# Patient Record
Sex: Female | Born: 1980 | Hispanic: Yes | Marital: Married | Smoking: Never smoker
Health system: Southern US, Community
[De-identification: ages and names within clinical notes are randomized; demographics above are authoritative.]

## PROBLEM LIST (undated history)

## (undated) DIAGNOSIS — O00109 Unspecified tubal pregnancy without intrauterine pregnancy: Secondary | ICD-10-CM

## (undated) DIAGNOSIS — O039 Complete or unspecified spontaneous abortion without complication: Secondary | ICD-10-CM

## (undated) HISTORY — PX: NO PAST SURGERIES: SHX2092

---

## 2008-11-20 ENCOUNTER — Emergency Department (HOSPITAL_COMMUNITY): Admission: EM | Admit: 2008-11-20 | Discharge: 2008-11-20 | Payer: Self-pay | Admitting: Emergency Medicine

## 2009-02-27 ENCOUNTER — Other Ambulatory Visit: Admission: RE | Admit: 2009-02-27 | Discharge: 2009-02-27 | Payer: Self-pay | Admitting: Obstetrics and Gynecology

## 2009-10-22 ENCOUNTER — Inpatient Hospital Stay (HOSPITAL_COMMUNITY): Admission: AD | Admit: 2009-10-22 | Discharge: 2009-10-23 | Payer: Self-pay | Admitting: Obstetrics & Gynecology

## 2009-10-23 ENCOUNTER — Inpatient Hospital Stay (HOSPITAL_COMMUNITY): Admission: RE | Admit: 2009-10-23 | Discharge: 2009-10-24 | Payer: Self-pay | Admitting: Obstetrics and Gynecology

## 2009-10-23 ENCOUNTER — Ambulatory Visit: Payer: Self-pay | Admitting: Family Medicine

## 2009-10-28 ENCOUNTER — Observation Stay (HOSPITAL_COMMUNITY): Admission: AD | Admit: 2009-10-28 | Discharge: 2009-10-28 | Payer: Self-pay | Admitting: Obstetrics & Gynecology

## 2009-10-28 ENCOUNTER — Ambulatory Visit: Payer: Self-pay | Admitting: Family Medicine

## 2010-01-09 ENCOUNTER — Emergency Department (HOSPITAL_COMMUNITY): Admission: EM | Admit: 2010-01-09 | Discharge: 2010-01-09 | Payer: Self-pay | Admitting: Emergency Medicine

## 2010-10-02 ENCOUNTER — Emergency Department (HOSPITAL_COMMUNITY): Admission: EM | Admit: 2010-10-02 | Discharge: 2010-10-02 | Payer: Self-pay | Admitting: Emergency Medicine

## 2011-02-16 LAB — DIFFERENTIAL
Basophils Absolute: 0 10*3/uL (ref 0.0–0.1)
Basophils Relative: 0 % (ref 0–1)
Neutro Abs: 2.9 10*3/uL (ref 1.7–7.7)
Neutrophils Relative %: 48 % (ref 43–77)

## 2011-02-16 LAB — BASIC METABOLIC PANEL
BUN: 9 mg/dL (ref 6–23)
Calcium: 8.7 mg/dL (ref 8.4–10.5)
GFR calc non Af Amer: >60 mL/min (ref >60)
Glucose, Bld: 93 mg/dL (ref 70–99)
Sodium: 138 mEq/L (ref 135–145)

## 2011-02-16 LAB — URINALYSIS, ROUTINE W REFLEX MICROSCOPIC
Nitrite: NEGATIVE
Protein, ur: NEGATIVE mg/dL
Specific Gravity, Urine: <1.005 — ABNORMAL LOW (ref 1.005–1.030)
Urobilinogen, UA: 0.2 mg/dL (ref 0.0–1.0)

## 2011-02-16 LAB — GC/CHLAMYDIA PROBE AMP, GENITAL
Chlamydia, DNA Probe: NEGATIVE
GC Probe Amp, Genital: NEGATIVE

## 2011-02-16 LAB — CBC
MCHC: 33.7 g/dL (ref 30.0–36.0)
Platelets: 252 10*3/uL (ref 150–400)
RDW: 13.5 % (ref 11.5–15.5)
WBC: 6.1 10*3/uL (ref 4.0–10.5)

## 2011-02-16 LAB — ABO/RH: ABO/RH(D): O POS

## 2011-02-16 LAB — WET PREP, GENITAL
Clue Cells Wet Prep HPF POC: NONE SEEN
Trich, Wet Prep: NONE SEEN

## 2011-02-16 LAB — URINE MICROSCOPIC-ADD ON

## 2011-02-16 LAB — HCG, QUANTITATIVE, PREGNANCY: hCG, Beta Chain, Quant, S: <2 m[IU]/mL (ref <5)

## 2011-02-24 LAB — URINALYSIS, ROUTINE W REFLEX MICROSCOPIC
Glucose, UA: NEGATIVE mg/dL
Ketones, ur: NEGATIVE mg/dL
Leukocytes, UA: NEGATIVE
Nitrite: NEGATIVE
Protein, ur: NEGATIVE mg/dL
Urobilinogen, UA: 0.2 mg/dL (ref 0.0–1.0)

## 2011-02-24 LAB — RAPID STREP SCREEN (MED CTR MEBANE ONLY): Streptococcus, Group A Screen (Direct): NEGATIVE

## 2011-03-02 ENCOUNTER — Ambulatory Visit: Payer: Self-pay | Admitting: Gastroenterology

## 2011-03-09 LAB — COMPREHENSIVE METABOLIC PANEL
AST: 52 U/L — ABNORMAL HIGH (ref 0–37)
Albumin: 2.7 g/dL — ABNORMAL LOW (ref 3.5–5.2)
CO2: 29 mEq/L (ref 19–32)
Calcium: 8.3 mg/dL — ABNORMAL LOW (ref 8.4–10.5)
Creatinine, Ser: 0.55 mg/dL (ref 0.4–1.2)
GFR calc Af Amer: >60 mL/min (ref >60)
GFR calc non Af Amer: >60 mL/min (ref >60)

## 2011-03-09 LAB — CBC
HCT: 33.9 % — ABNORMAL LOW (ref 36.0–46.0)
Hemoglobin: 11.1 g/dL — ABNORMAL LOW (ref 12.0–15.0)
MCHC: 33.6 g/dL (ref 30.0–36.0)
MCV: 92.2 fL (ref 78.0–100.0)
MCV: 92.8 fL (ref 78.0–100.0)
Platelets: 178 10*3/uL (ref 150–400)
Platelets: 220 10*3/uL (ref 150–400)
RBC: 3.48 MIL/uL — ABNORMAL LOW (ref 3.87–5.11)
WBC: 5.8 10*3/uL (ref 4.0–10.5)
WBC: 6.7 10*3/uL (ref 4.0–10.5)

## 2011-03-09 LAB — URINE MICROSCOPIC-ADD ON

## 2011-03-09 LAB — URINALYSIS, ROUTINE W REFLEX MICROSCOPIC
Bilirubin Urine: NEGATIVE
Bilirubin Urine: NEGATIVE
Glucose, UA: NEGATIVE mg/dL
Ketones, ur: NEGATIVE mg/dL
Protein, ur: NEGATIVE mg/dL
Specific Gravity, Urine: <1.005 — ABNORMAL LOW (ref 1.005–1.030)
Urobilinogen, UA: 0.2 mg/dL (ref 0.0–1.0)

## 2011-03-09 LAB — HEPATIC FUNCTION PANEL
AST: 50 U/L — ABNORMAL HIGH (ref 0–37)
Albumin: 2.7 g/dL — ABNORMAL LOW (ref 3.5–5.2)
Alkaline Phosphatase: 93 U/L (ref 39–117)
Total Bilirubin: 0.1 mg/dL — ABNORMAL LOW (ref 0.3–1.2)

## 2011-03-09 LAB — BASIC METABOLIC PANEL
CO2: 28 mEq/L (ref 19–32)
Calcium: 8.5 mg/dL (ref 8.4–10.5)
GFR calc Af Amer: >60 mL/min (ref >60)
GFR calc non Af Amer: >60 mL/min (ref >60)
Sodium: 139 mEq/L (ref 135–145)

## 2012-04-11 ENCOUNTER — Ambulatory Visit: Payer: Self-pay

## 2012-04-11 ENCOUNTER — Ambulatory Visit (HOSPITAL_COMMUNITY): Admission: RE | Admit: 2012-04-11 | Payer: Self-pay | Source: Ambulatory Visit

## 2012-04-11 ENCOUNTER — Ambulatory Visit (HOSPITAL_COMMUNITY)
Admission: RE | Admit: 2012-04-11 | Discharge: 2012-04-11 | Disposition: A | Discharge status: Home / Self Care | Payer: Self-pay | Source: Ambulatory Visit | Attending: Orthopedic Surgery | Admitting: Orthopedic Surgery

## 2012-04-11 ENCOUNTER — Encounter: Payer: Self-pay | Admitting: Orthopedic Surgery

## 2012-04-11 ENCOUNTER — Other Ambulatory Visit: Payer: Self-pay | Admitting: Orthopedic Surgery

## 2012-04-11 ENCOUNTER — Telehealth: Payer: Self-pay | Admitting: *Deleted

## 2012-04-11 ENCOUNTER — Ambulatory Visit (INDEPENDENT_AMBULATORY_CARE_PROVIDER_SITE_OTHER): Payer: Self-pay | Admitting: Orthopedic Surgery

## 2012-04-11 VITALS — BP 102/60 | Ht 65.0 in | Wt 172.0 lb

## 2012-04-11 DIAGNOSIS — M25561 Pain in right knee: Secondary | ICD-10-CM

## 2012-04-11 DIAGNOSIS — M2241 Chondromalacia patellae, right knee: Secondary | ICD-10-CM

## 2012-04-11 DIAGNOSIS — S8000XA Contusion of unspecified knee, initial encounter: Secondary | ICD-10-CM

## 2012-04-11 DIAGNOSIS — M25569 Pain in unspecified knee: Secondary | ICD-10-CM

## 2012-04-11 DIAGNOSIS — M2240 Chondromalacia patellae, unspecified knee: Secondary | ICD-10-CM

## 2012-04-11 MED ORDER — PREDNISONE 5 MG PO KIT: 5.0000 mg | PACK | ORAL | Status: DC

## 2012-04-11 MED ORDER — ACETAMINOPHEN-CODEINE 300-30 MG PO TABS: 1.0000 | ORAL_TABLET | ORAL | Status: AC | PRN

## 2012-04-11 NOTE — Telephone Encounter (Signed)
Call complete

## 2012-04-11 NOTE — Patient Instructions (Signed)
You have been given 2 prescriptions. One is at the pharmacy. Please pick up the other one has been given to you by hand. Please take to the pharmacy to get filled.  No surgical treatment is necessary. This will be self-limiting

## 2012-04-11 NOTE — Progress Notes (Signed)
  Subjective:    Kim Marks is a 31 y.o. female who presents the following history. About one month ago. The patient was moving her crib and hit the RIGHT knee against the side of the crib since that time. She is a dull, aching anterior knee pain with 6/10 in intensity, which seems to come and go is worse with steps. She also feels crepitation when climbing stairs. She's noticed some swelling and some numbness at the site of injury. She had some swelling and not formed initially, but has resolved. No previous treatment. Review of systems blurred vision or eye pain, constipation, and numbness, otherwise, normal The following portions of the patient's history were reviewed and updated as appropriate: allergies, current medications, past family history, past medical history, past social history, past surgical history and problem list.   Review of Systems Pertinent items are noted in HPI.   Objective:    BP 102/60  Ht 5\' 5"  (1.651 m)  Wt 172 lb (78.019 kg)  BMI 28.62 kg/m2 Vital signs are stable as recorded  General appearance is normal  The patient is alert and oriented x3  The patient's mood and affect are normal  Gait assessment: normal  The cardiovascular exam reveals normal pulses and temperature without edema swelling.  The lymphatic system is negative for palpable lymph nodes  The sensory exam is normal.  There are no pathologic reflexes.  Balance is normal.   Exam of the RIGHT knee reveals crepitation on range of motion tenderness over the lateral joint line, just below the joint on the tibial surface. Range of motion is normal. The knee is stable. Strength is normal. Skin is intact.  Exam of the LEFT knee reveals mild crepitation. Normal range of motion strength, stability and alignment with normal skin and      X-ray x-ray had been done at the [facility] secondary to equipment malfunction:   The x-ray was normal   Assessment:    Right Moderate  patellofemoral syndrome bilaterally    Plan:    recommend medication for this should be self-limiting. No further treatment need x-ray followup will be done in a phone call will be made

## 2012-04-12 ENCOUNTER — Ambulatory Visit: Payer: Self-pay | Admitting: Orthopedic Surgery

## 2012-08-23 IMAGING — US US OB COMP LESS 14 WK
1 series · 14 of 28 positions shown · non-contrast
Comparison: None.

CLINICAL DATA: Report of pregnancy.  Vaginal bleeding and pain.
Estimated gestational age by LMP 6 weeks 1 day.  Quantitative beta
HCG level not available the time ultrasound was performed.

OBSTETRIC <14 WK US AND TRANSVAGINAL OB US
TECHNIQUE: Both transabdominal and transvaginal ultrasound
examinations were performed for complete evaluation of the
gestation as well as the maternal uterus, adnexal regions, and
pelvic cul-de-sac.  Transvaginal technique was performed to assess
early pregnancy.

[Series 1: us ob comp less 14 wk · 0.26mm/px · 83 acquisitions, 14 frames shown]
[im 4/83]
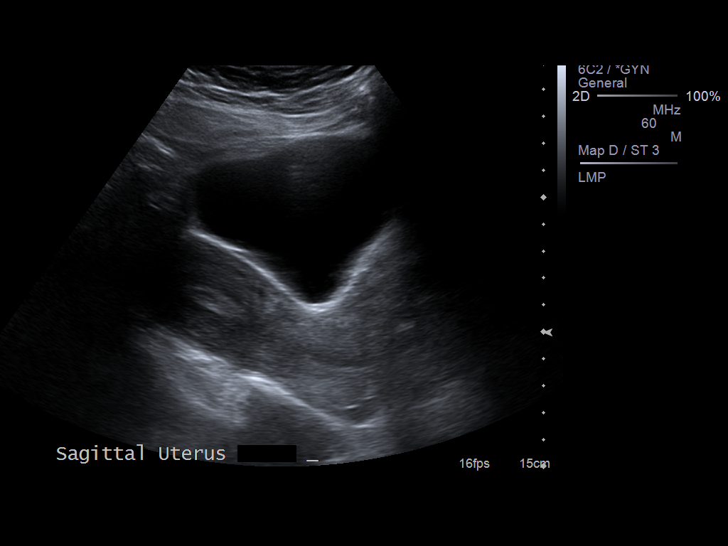
[im 10/83]
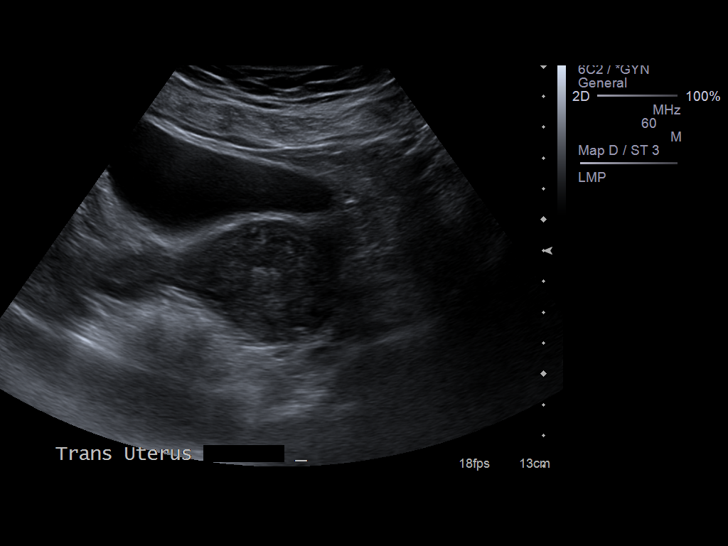
[im 16/83]
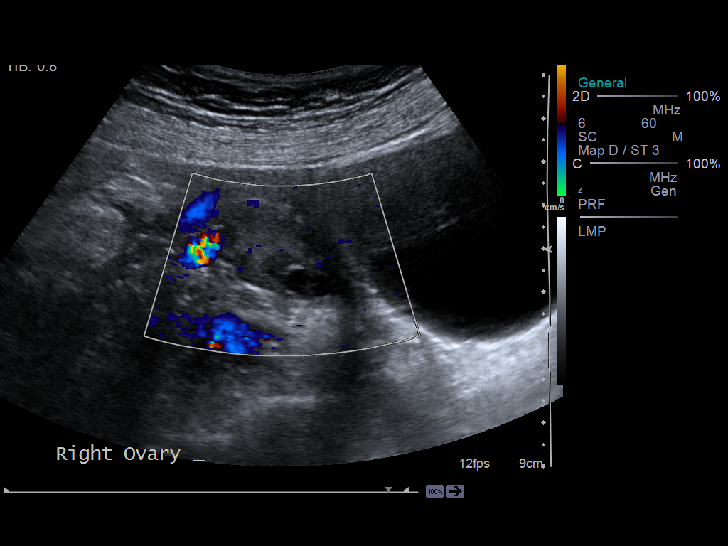
[im 22/83]
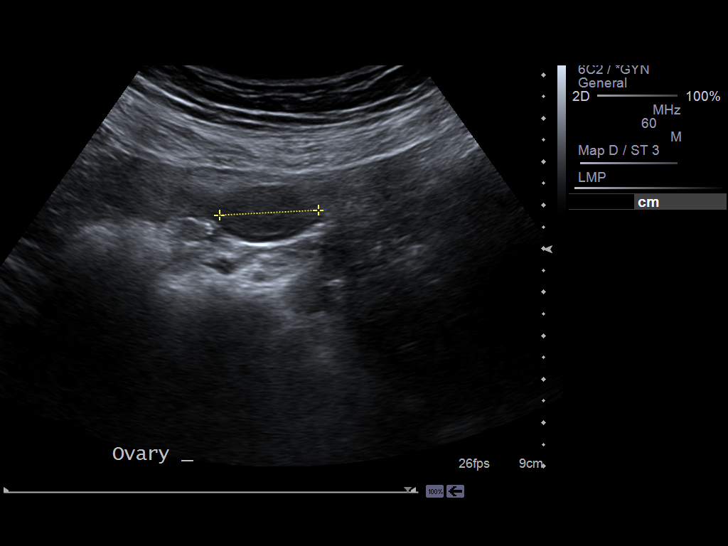
[im 28/83]
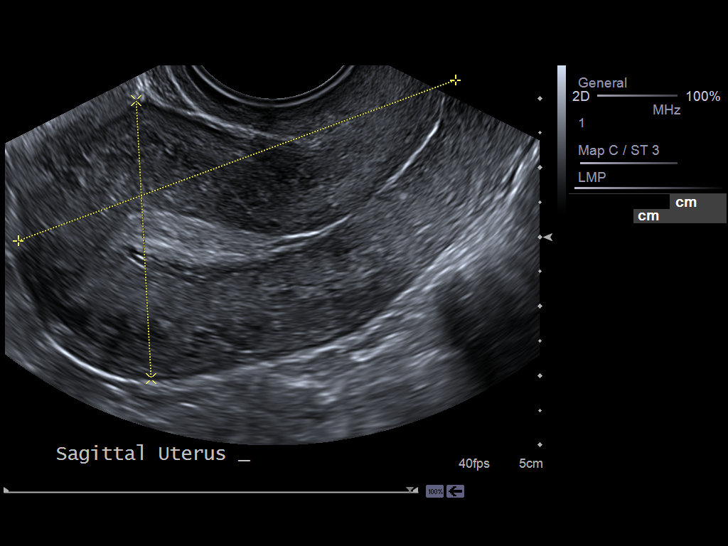
[im 34/83]
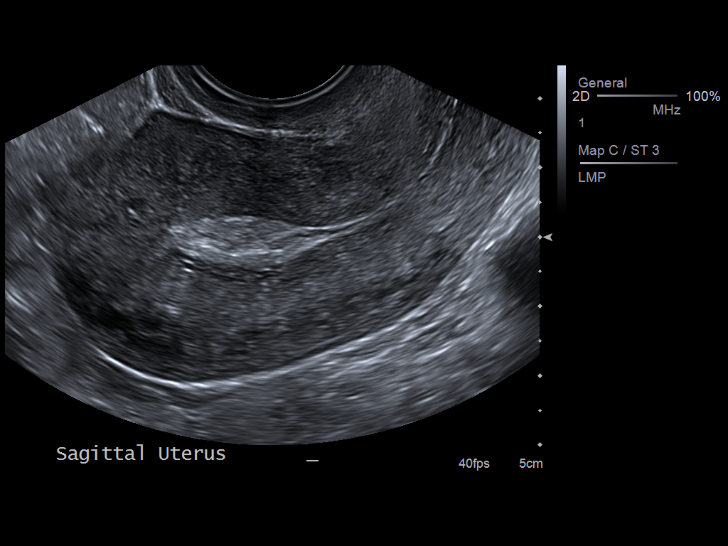
[im 40/83]
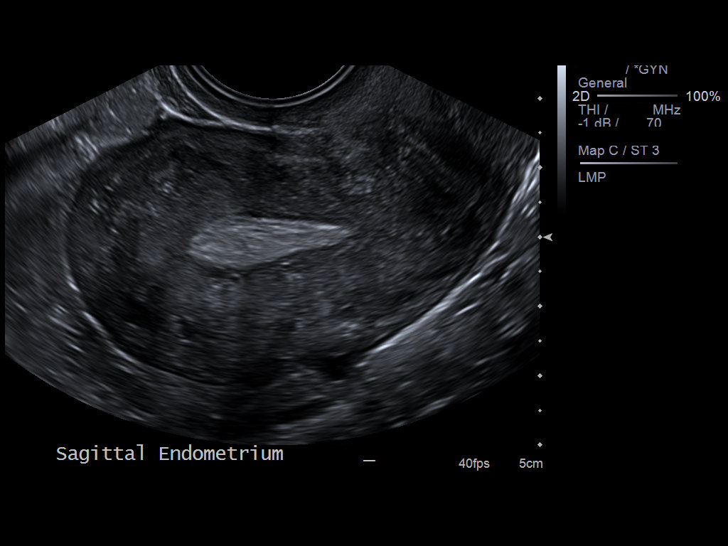
[im 46/83]
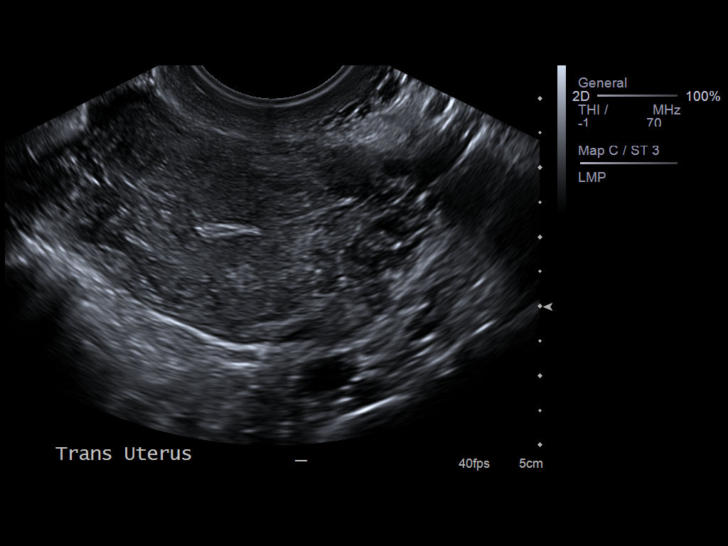
[im 52/83]
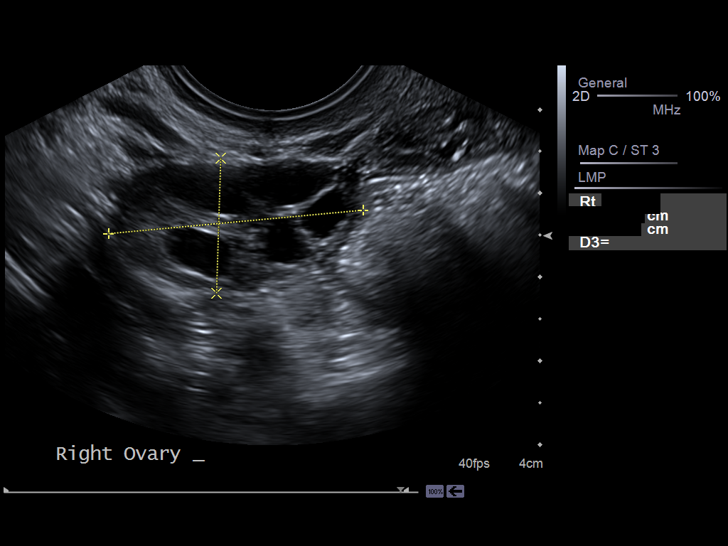
[im 58/83]
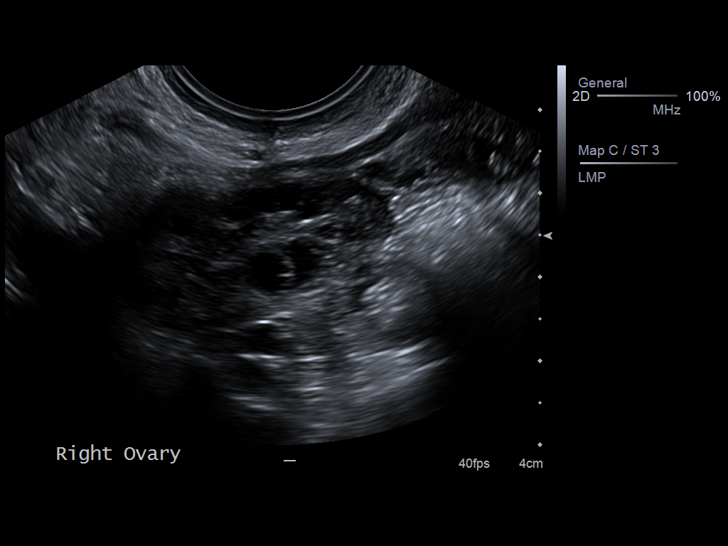
[im 64/83]
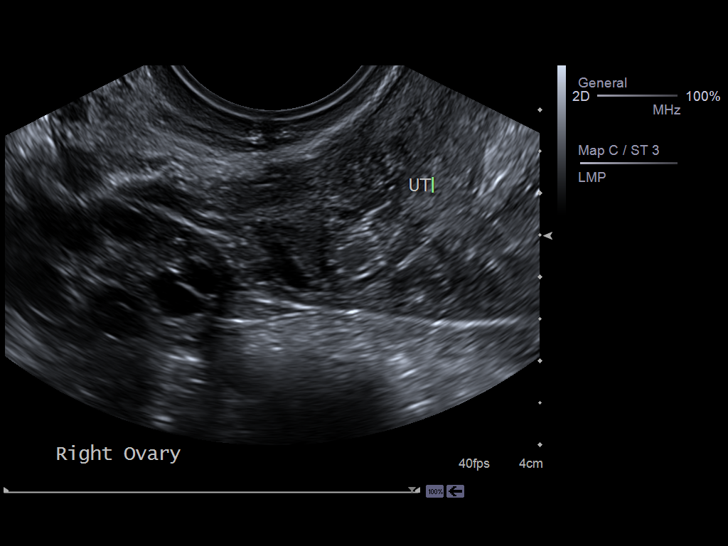
[im 70/83]
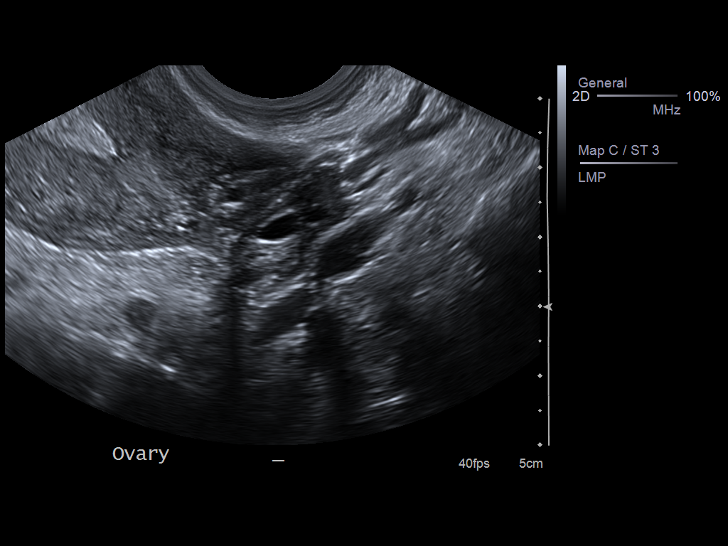
[im 76/83]
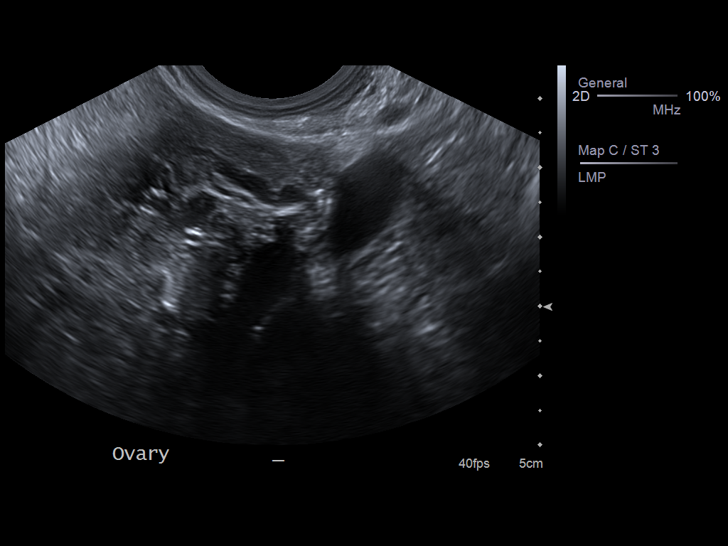
[im 83/83]
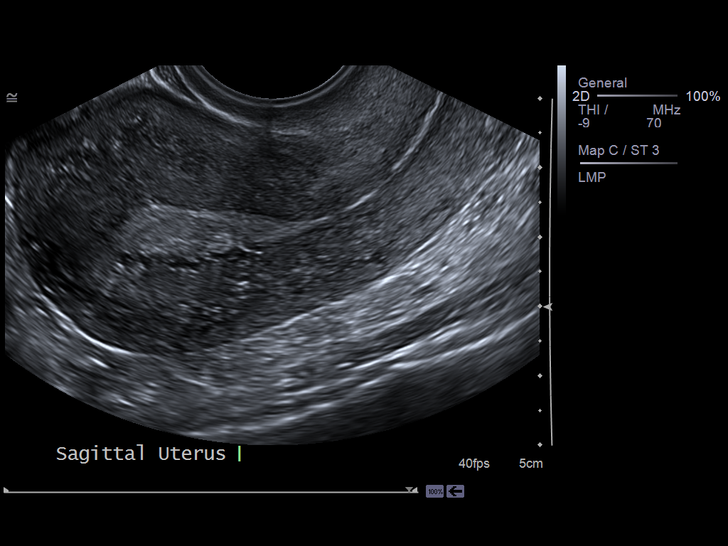

[14 of 28 positions shown; findings below may reference images not displayed]

Intrauterine gestational sac:  None
Yolk sac: None
Embryo: None

Maternal uterus/adnexae:

The endometrium measures 8 mm in thickness and is homogeneous. Both
ovaries have normal sonographic appearances, with several follicles
within each ovary.  Color Doppler flow is identified in both
ovaries.

No adnexal mass or free fluid is seen.
IMPRESSION: No intrauterine pregnancy or adnexal mass is identified.
Endometrium measures 8 mm.

Considerations include spontaneous abortion,  IUP too early to
visualize, or occult ectopic pregnancy.  Suggest correlation with
Beta HCG levels, and follow-up ultrasound as clinically indicated.

## 2013-09-07 ENCOUNTER — Other Ambulatory Visit: Payer: Self-pay | Admitting: Obstetrics and Gynecology

## 2013-10-22 ENCOUNTER — Ambulatory Visit: Payer: Self-pay | Admitting: Orthopedic Surgery

## 2013-10-22 ENCOUNTER — Encounter: Payer: Self-pay | Admitting: Orthopedic Surgery

## 2014-03-03 IMAGING — CR DG KNEE AP/LAT W/ SUNRISE*R*
3 series · 3 of 3 positions shown · non-contrast
Comparison: None

CLINICAL DATA: Injured right knee.

DG KNEE - 3 VIEWS

[view not recorded (1 of 3)]
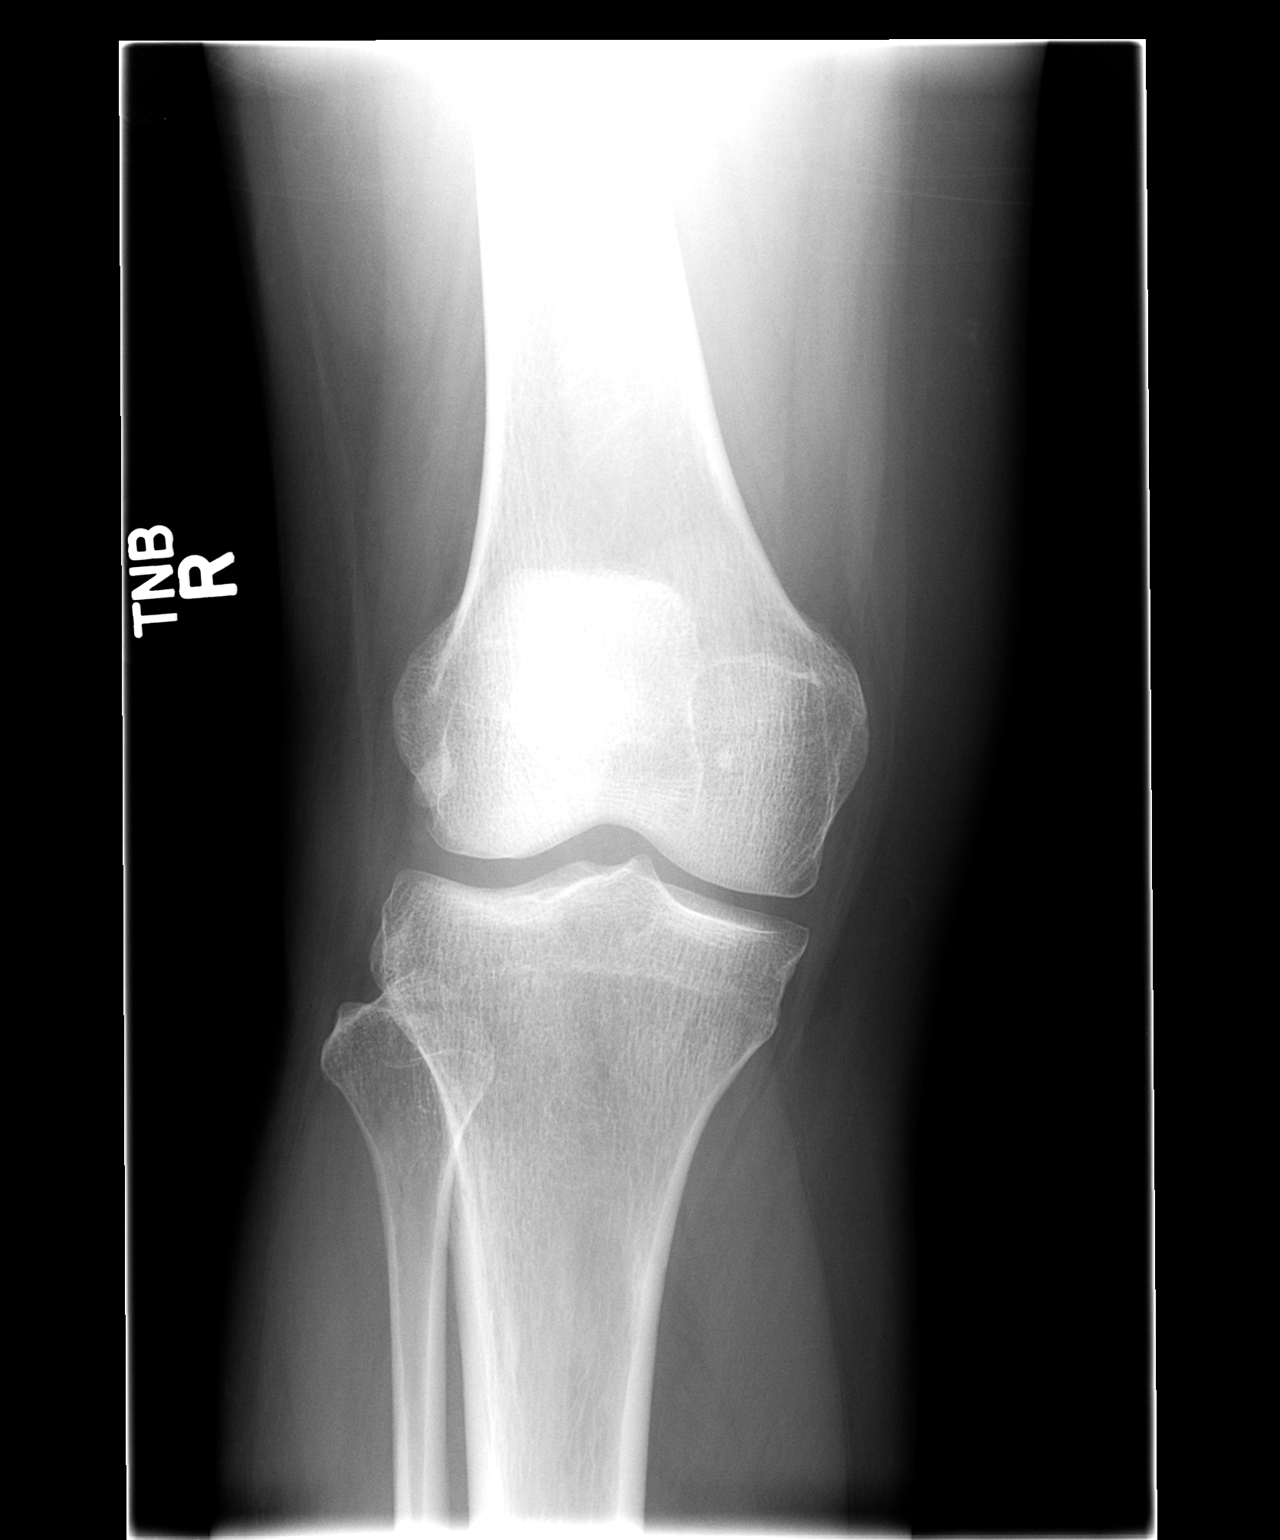

[view not recorded (2 of 3)]
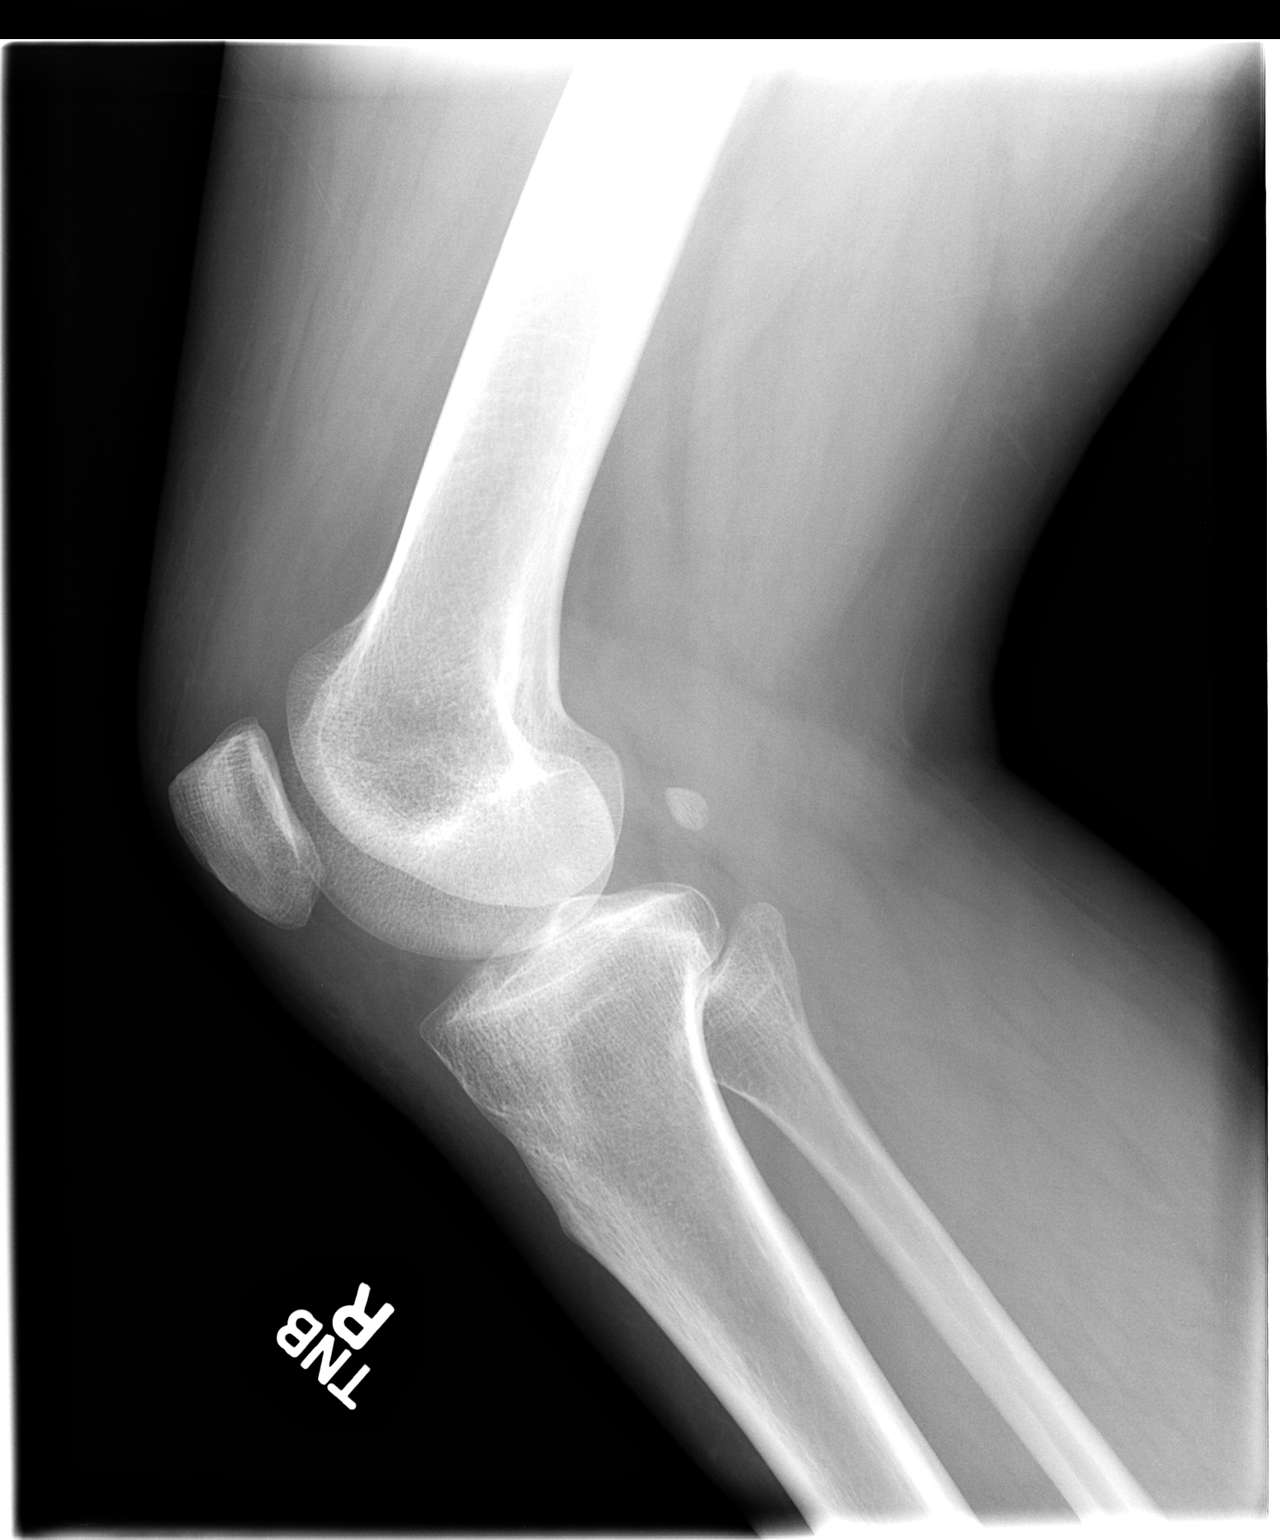

[view not recorded (3 of 3)]
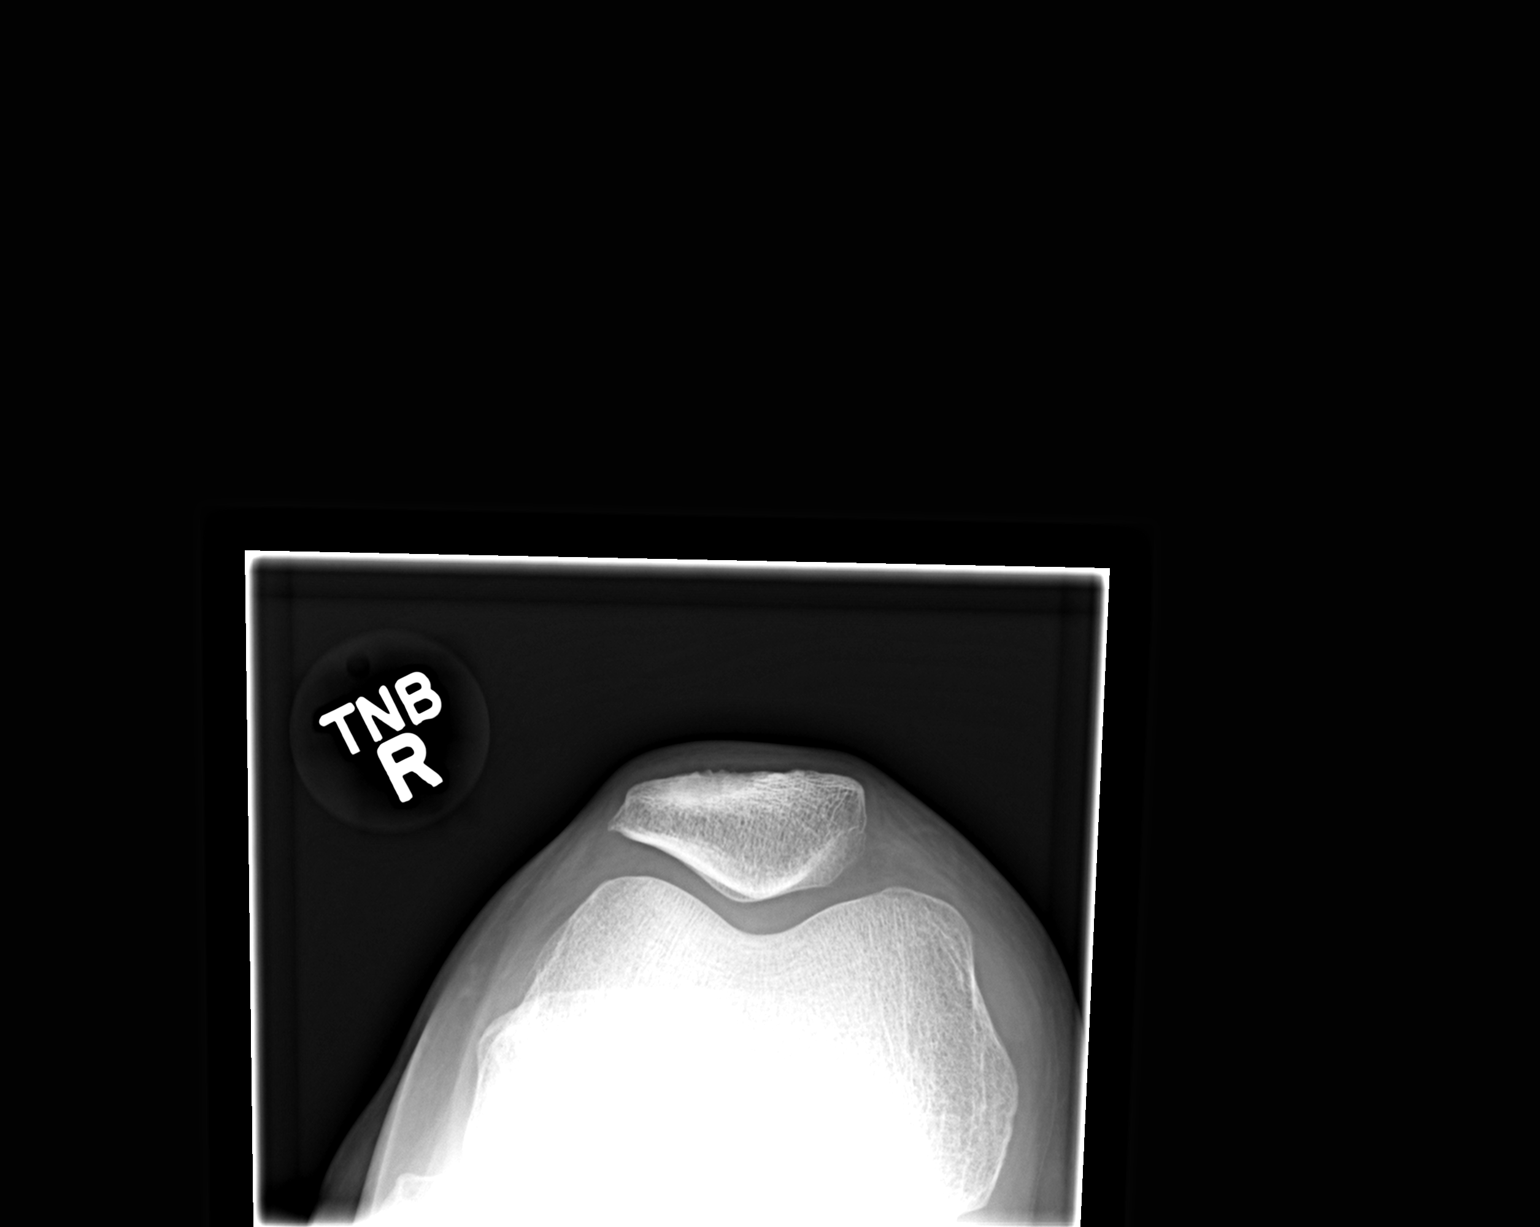

[3 of 3 positions shown; findings below may reference images not displayed]

FINDINGS: The joint spaces are maintained.  No acute bony findings
or osteochondral abnormality.  No degenerative changes.  No
definite joint effusion.
IMPRESSION: No acute bony findings.

## 2014-12-05 NOTE — L&D Delivery Note (Cosign Needed)
Delivery Note Pt progressed nicely and after a 10  Minute 2nd stage, at 8:56 AM a viable female was delivered via Vaginal, Spontaneous Delivery (Presentation: ;  ).  APGAR: 8, 9; weight pending.  After 3 minutes, the cord was clamped and cut. 40 units of pitocin diluted in 1000cc LR was infused rapidly IV.  The placenta separated spontaneously and delivered via CCT and maternal pushing effort.  It was inspected and appears to be intact with a 3 VC.   Anesthesia: Epidural  Episiotomy: None Lacerations: None Suture Repair: na Est. Blood Loss (mL): 84  Mom to postpartum.  Baby to Couplet care / Skin to Skin.  CRESENZO-DISHMAN,Trevious Rampey 08/21/2015, 9:13 AM

## 2015-01-27 ENCOUNTER — Other Ambulatory Visit: Payer: Self-pay | Admitting: Obstetrics & Gynecology

## 2015-01-27 DIAGNOSIS — O3680X0 Pregnancy with inconclusive fetal viability, not applicable or unspecified: Secondary | ICD-10-CM

## 2015-01-30 ENCOUNTER — Encounter: Payer: Self-pay | Admitting: *Deleted

## 2015-02-02 ENCOUNTER — Ambulatory Visit (INDEPENDENT_AMBULATORY_CARE_PROVIDER_SITE_OTHER): Payer: Commercial Indemnity

## 2015-02-02 ENCOUNTER — Other Ambulatory Visit: Payer: Self-pay | Admitting: Obstetrics & Gynecology

## 2015-02-02 DIAGNOSIS — O3680X0 Pregnancy with inconclusive fetal viability, not applicable or unspecified: Secondary | ICD-10-CM

## 2015-02-02 DIAGNOSIS — O26841 Uterine size-date discrepancy, first trimester: Secondary | ICD-10-CM

## 2015-02-02 NOTE — Progress Notes (Signed)
U/S-single IUP with +FCA noted, FHR-165 bpm, cx appears closed, bilateral adnexa appears WNL, CRL c/w 12+1wks EDD 08/16/2015, pt refused NT/IT screen, anterior Gr 0 placenta

## 2015-02-03 ENCOUNTER — Emergency Department (HOSPITAL_COMMUNITY)
Admission: EM | Admit: 2015-02-03 | Discharge: 2015-02-04 | Disposition: A | Discharge status: Home / Self Care | Payer: Managed Care, Other (non HMO) | Attending: Emergency Medicine | Admitting: Emergency Medicine

## 2015-02-03 ENCOUNTER — Encounter (HOSPITAL_COMMUNITY): Payer: Self-pay

## 2015-02-03 DIAGNOSIS — Z3A12 12 weeks gestation of pregnancy: Secondary | ICD-10-CM | POA: Insufficient documentation

## 2015-02-03 DIAGNOSIS — Z79899 Other long term (current) drug therapy: Secondary | ICD-10-CM | POA: Insufficient documentation

## 2015-02-03 DIAGNOSIS — O209 Hemorrhage in early pregnancy, unspecified: Secondary | ICD-10-CM | POA: Insufficient documentation

## 2015-02-03 HISTORY — DX: Complete or unspecified spontaneous abortion without complication: O03.9

## 2015-02-03 HISTORY — DX: Unspecified tubal pregnancy without intrauterine pregnancy: O00.109

## 2015-02-03 LAB — BASIC METABOLIC PANEL
ANION GAP: 7 (ref 5–15)
BUN: 8 mg/dL (ref 6–23)
CO2: 24 mmol/L (ref 19–32)
Calcium: 9 mg/dL (ref 8.4–10.5)
Chloride: 103 mmol/L (ref 96–112)
Creatinine, Ser: 0.5 mg/dL (ref 0.50–1.10)
GFR calc Af Amer: >90 mL/min (ref >90)
GFR calc non Af Amer: >90 mL/min (ref >90)
Glucose, Bld: 102 mg/dL — ABNORMAL HIGH (ref 70–99)
Potassium: 3.3 mmol/L — ABNORMAL LOW (ref 3.5–5.1)
SODIUM: 134 mmol/L — AB (ref 135–145)

## 2015-02-03 LAB — CBC WITH DIFFERENTIAL/PLATELET
BASOS PCT: 0 % (ref 0–1)
Basophils Absolute: 0 10*3/uL (ref 0.0–0.1)
EOS ABS: 0.1 10*3/uL (ref 0.0–0.7)
EOS PCT: 1 % (ref 0–5)
HEMATOCRIT: 34.1 % — AB (ref 36.0–46.0)
HEMOGLOBIN: 12 g/dL (ref 12.0–15.0)
LYMPHS ABS: 2.7 10*3/uL (ref 0.7–4.0)
Lymphocytes Relative: 40 % (ref 12–46)
MCH: 31.5 pg (ref 26.0–34.0)
MCHC: 35.2 g/dL (ref 30.0–36.0)
MCV: 89.5 fL (ref 78.0–100.0)
MONO ABS: 0.4 10*3/uL (ref 0.1–1.0)
MONOS PCT: 6 % (ref 3–12)
Neutro Abs: 3.6 10*3/uL (ref 1.7–7.7)
Neutrophils Relative %: 53 % (ref 43–77)
Platelets: 206 10*3/uL (ref 150–400)
RBC: 3.81 MIL/uL — AB (ref 3.87–5.11)
RDW: 13 % (ref 11.5–15.5)
WBC: 6.7 10*3/uL (ref 4.0–10.5)

## 2015-02-03 NOTE — ED Notes (Addendum)
Patient is 12 weeks and 2 days pregnant. Around 2200 patient began having bright red vaginal bleeding, states this bleeding is similar to bleeding during a menstrual cycle.  Patients husband is present. Pt seen OB yesterday and given an ultrasound which was "normal".

## 2015-02-03 NOTE — ED Provider Notes (Signed)
CSN: [id number]     Arrival date & time 02/03/15  2228 History   First MD Initiated Contact with Patient 02/03/15 2243     Chief Complaint  Patient presents with  . Vaginal Bleeding     (Consider location/radiation/quality/duration/timing/severity/associated sxs/prior Treatment) HPI Kim Marks is a 34 y.o. female who presents to the ED with vagina bleeding that started suddenly at approximately 2200. One episode that was about the amount of a period. Has stopped since then. Last sexual intercourse 4 days ago. Just in the office today and had ultrasound that showed a viable IUP @ 12.[redacted] weeks gestation.   Past Medical History  Diagnosis Date  . Miscarriage   . Tubal ectopic pregnancy    Past Surgical History  Procedure Laterality Date  . No past surgeries     History reviewed. No pertinent family history. History  Substance Use Topics  . Smoking status: Never Smoker   . Smokeless tobacco: Not on file  . Alcohol Use: No   OB History    Gravida Para Term Preterm AB TAB SAB Ectopic Multiple Living       Review of Systems Negative except as stated in HPI   Allergies  Review of patient's allergies indicates no known allergies.  Home Medications   Prior to Admission medications   Medication Sig Start Date End Date Taking? Authorizing Provider  ibuprofen (ADVIL,MOTRIN) 200 MG tablet Take 200 mg by mouth every 6 (six) hours as needed for headache, mild pain, moderate pain or cramping.   Yes Historical Provider, MD  Prenatal Vit-Fe Fumarate-FA (PRENATAL MULTIVITAMIN) TABS tablet Take 1 tablet by mouth daily at 12 noon.   Yes Historical Provider, MD   BP 105/66 mmHg  Pulse 87  Temp(Src) 97.9 F (36.6 C) (Oral)  Resp 16  Ht  (1.676 m)  Wt 163 lb 3.2 oz (74.027 kg)  BMI 26.35 kg/m2  SpO2 98%  LMP 11/20/2014 (Approximate) Physical Exam  Constitutional: She is oriented to person, place, and time. She appears well-developed and well-nourished.   HENT:  Head: Normocephalic and atraumatic.  Eyes: EOM are normal.  Neck: Normal range of motion. Neck supple.  Cardiovascular: Normal rate and regular rhythm.   Pulmonary/Chest: Effort normal. She has no wheezes. She has no rales.  Abdominal: Soft. Bowel sounds are normal. There is no tenderness.  Doppler FHT's 165  Genitourinary:  External genitalia without lesions. Scant dark blood vaginal vault. Cervix closed, thick, high. No CMT, no adnexal tenderness. Uterus approximately 12 week size.   Musculoskeletal: Normal range of motion.  Neurological: She is alert and oriented to person, place, and time. No cranial nerve deficit.  Skin: Skin is warm and dry.  Psychiatric: She has a normal mood and affect. Her behavior is normal.  Nursing note and vitals reviewed.   ED Course  Procedures (including critical care time) Results for orders placed or performed during the hospital encounter of 02/03/15 (from the past 24 hour(s))  Basic metabolic panel     Status: Abnormal   Collection Time: 02/03/15 10:57 PM  Result Value Ref Range   Sodium 134 (L) 135 - 145 mmol/L   Potassium 3.3 (L) 3.5 - 5.1 mmol/L   Chloride 103 96 - 112 mmol/L   CO2 24 19 - 32 mmol/L   Glucose, Bld 102 (H) 70 - 99 mg/dL   BUN 8 6 - 23 mg/dL   Creatinine, Ser 7.82 0.50 - 1.10  mg/dL   Calcium 9.0 8.4 - 16.110.5 mg/dL   GFR calc non Af Amer >90 >90 mL/min   GFR calc Af Amer >90 >90 mL/min   Anion gap 7 5 - 15  CBC with Differential     Status: Abnormal   Collection Time: 02/03/15 10:57 PM  Result Value Ref Range   WBC 6.7 4.0 - 10.5 K/uL   RBC 3.81 (L) 3.87 - 5.11 MIL/uL   Hemoglobin 12.0 12.0 - 15.0 g/dL   HCT 09.634.1 (L) 04.536.0 - 40.946.0 %   MCV 89.5 78.0 - 100.0 fL   MCH 31.5 26.0 - 34.0 pg   MCHC 35.2 30.0 - 36.0 g/dL   RDW 81.113.0 91.411.5 - 78.215.5 %   Platelets 206 150 - 400 K/uL   Neutrophils Relative % 53 43 - 77 %   Neutro Abs 3.6 1.7 - 7.7 K/uL   Lymphocytes Relative 40 12 - 46 %   Lymphs Abs 2.7 0.7 - 4.0 K/uL    Monocytes Relative 6 3 - 12 %   Monocytes Absolute 0.4 0.1 - 1.0 K/uL   Eosinophils Relative 1 0 - 5 %   Eosinophils Absolute 0.1 0.0 - 0.7 K/uL   Basophils Relative 0 0 - 1 %   Basophils Absolute 0.0 0.0 - 0.1 K/uL  Urinalysis, Routine w reflex microscopic     Status: Abnormal   Collection Time: 02/03/15 11:23 PM  Result Value Ref Range   Color, Urine STRAW (A) YELLOW   APPearance CLEAR CLEAR   Specific Gravity, Urine <1.005 (L) 1.005 - 1.030   pH 6.0 5.0 - 8.0   Glucose, UA NEGATIVE NEGATIVE mg/dL   Hgb urine dipstick LARGE (A) NEGATIVE   Bilirubin Urine NEGATIVE NEGATIVE   Ketones, ur NEGATIVE NEGATIVE mg/dL   Protein, ur NEGATIVE NEGATIVE mg/dL   Urobilinogen, UA 0.2 0.0 - 1.0 mg/dL   Nitrite NEGATIVE NEGATIVE   Leukocytes, UA TRACE (A) NEGATIVE  Urine microscopic-add on     Status: Abnormal   Collection Time: 02/03/15 11:23 PM  Result Value Ref Range   Squamous Epithelial / LPF FEW (A) RARE   WBC, UA 0-2 <3 WBC/hpf   RBC / HPF 0-2 <3 RBC/hpf   Bacteria, UA RARE RARE    Imaging Review [place] Ob Comp Less 14 Wks  02/02/2015   DATING AND VIABILITY SONOGRAM   Kim Marks is a 34 y.o. year old [id number] with LMP ?11/20/2014  which would correlate to  76102w4d weeks gestation.  She has regular  menstrual cycles.   She is here today for a confirmatory initial sonogram.    GESTATION: SINGLETON     FETAL ACTIVITY:          Heart rate         165 bpm          The fetus is active.  PLACENTA LOCALIZATION:  anterior GRADE 0  CERVIX: Appears closed   ADNEXA: The ovaries are normal.  GESTATIONAL AGE AND  BIOMETRICS:  Gestational criteria: Estimated Date of Delivery: 08/27/15 by LMP now at  62102w4d  Previous Scans:0  GESTATIONAL SAC            mm          weeks  CROWN RUMP LENGTH           58.8 mm         12+1 weeks  AVERAGE EGA(BY THIS SCAN):   12+1 weeks  WORKING EDD( early ultrasound ):  08/16/2015     TECHNICIAN COMMENTS:   U/S-single IUP with +FCA noted, FHR-165 bpm, cx appears closed, bilateral  adnexa appears WNL, CRL c/w 12+1wks EDD 08/16/2015, pt refused NT/IT  screen, anterior Gr 0 placenta   A copy of this report including all images has been saved and backed up to  a second source for retrieval if needed. All measures and details of the  anatomical scan, placentation, fluid volume and pelvic anatomy are  contained in that report.  Chari Manning 02/02/2015 1:55 PM Clinical Impression and recommendations:  I have reviewed the sonogram results above.  Combined with the patient's current clinical course, below are my  impressions and any appropriate recommendations for management based on  the sonographic findings:  1. [place] IUP with EDD REVISED BY THIS U/S TO 08/16/15. 2. Normal maternal anatomy.  Tilda Burrow    MDM  34 y.o. female @ [redacted] weeks gestation with sudden onset of vaginal bleeding tonight at 10:00 pm and none since then. Stable for d/c without hemorrhage and positive FHT's. She will follow up with [facility]. She will call the office tomorrow to let them know she was here with bleeding. I also sent a message to Dr. Emelda Fear in [facility].  Instructions given for threatened AB.  Patient will return here as needed.   Final diagnoses:  Vaginal bleeding in pregnancy, first trimester      Janne Napoleon, NP 02/04/15 [address] [place], NP 02/04/15 0023  Rolland Porter, MD 02/16/15 [phone]

## 2015-02-04 ENCOUNTER — Encounter: Payer: Self-pay | Admitting: Advanced Practice Midwife

## 2015-02-04 ENCOUNTER — Ambulatory Visit (INDEPENDENT_AMBULATORY_CARE_PROVIDER_SITE_OTHER): Payer: Commercial Indemnity | Admitting: Advanced Practice Midwife

## 2015-02-04 ENCOUNTER — Telehealth: Payer: Self-pay | Admitting: Advanced Practice Midwife

## 2015-02-04 VITALS — BP 112/62 | HR 72 | Ht 66.0 in | Wt 163.0 lb

## 2015-02-04 DIAGNOSIS — O209 Hemorrhage in early pregnancy, unspecified: Secondary | ICD-10-CM

## 2015-02-04 DIAGNOSIS — O4691 Antepartum hemorrhage, unspecified, first trimester: Secondary | ICD-10-CM

## 2015-02-04 LAB — URINALYSIS, ROUTINE W REFLEX MICROSCOPIC
Bilirubin Urine: NEGATIVE
Glucose, UA: NEGATIVE mg/dL
Ketones, ur: NEGATIVE mg/dL
Nitrite: NEGATIVE
PROTEIN: NEGATIVE mg/dL
Specific Gravity, Urine: <1.005 — ABNORMAL LOW (ref 1.005–1.030)
UROBILINOGEN UA: 0.2 mg/dL (ref 0.0–1.0)
pH: 6 (ref 5.0–8.0)

## 2015-02-04 LAB — URINE MICROSCOPIC-ADD ON

## 2015-02-04 NOTE — Progress Notes (Signed)
Family [facility] Clinic Visit  Patient name: Kim Marks MRN [id number]  Date of birth: 30-Jul-1981  CC & HPI:  Kim Marks is a 34 y.o. Hispanic female presenting today for F/U from ED yesterday.  She had a "large amount" of sudden bright red vaginal bleeding without pain which quickly stopped.  [place] was normal.  PElvic exam was normal.  Presumed SCH. No pain now, only dark brown spotting  Pertinent History Reviewed:  Medical & Surgical Hx:   Past Medical History  Diagnosis Date  . Miscarriage   . Tubal ectopic pregnancy    Past Surgical History  Procedure Laterality Date  . No past surgeries     History reviewed. No pertinent family history.  Current outpatient prescriptions:  .  Prenatal Vit-Fe Fumarate-FA (PRENATAL MULTIVITAMIN) TABS tablet, Take 1 tablet by mouth daily at 12 noon., Disp: , Rfl:  .  ibuprofen (ADVIL,MOTRIN) 200 MG tablet, Take 200 mg by mouth every 6 (six) hours as needed for headache, mild pain, moderate pain or cramping., Disp: , Rfl:  Social History: Reviewed -  reports that she has never smoked. She has never used smokeless tobacco.  Review of Systems:   Constitutional: Negative for fever and chills Gastrointestinal: Negative for abdominal pain Genitourinary: Negative for dysuria and urgency, vaginal irritation or itching   Objective Findings:  Vitals: BP 112/62 mmHg  Pulse 72  Ht  (1.676 m)  Wt 163 lb (73.936 kg)  BMI 26.32 kg/m2  LMP 11/20/2014 (Approximate)  Physical Examination: General appearance - alert, well appearing, and in no distress Mental status - alert, oriented to person, place, and time Abdomen - soft, nontender, nondistended, no masses.  FHR 160  Pelvic - SSE:  Scant amount dark blood.  Normal dc.  Cx firm, closed. Musculoskeletal - no muscular tenderness noted Extremities - no pedal edema noted  Results for orders placed or performed during the hospital encounter of 02/03/15 (from the past 24 hour(s))  Basic  metabolic panel   Collection Time: 02/03/15 10:57 PM  Result Value Ref Range   Sodium 134 (L) 135 - 145 mmol/L   Potassium 3.3 (L) 3.5 - 5.1 mmol/L   Chloride 103 96 - 112 mmol/L   CO2 24 19 - 32 mmol/L   Glucose, Bld 102 (H) 70 - 99 mg/dL   BUN 8 6 - 23 mg/dL   Creatinine, Ser 4.09 0.50 - 1.10 mg/dL   Calcium 9.0 8.4 - 81.1 mg/dL   GFR calc non Af Amer >90 >90 mL/min   GFR calc Af Amer >90 >90 mL/min   Anion gap 7 5 - 15  CBC with Differential   Collection Time: 02/03/15 10:57 PM  Result Value Ref Range   WBC 6.7 4.0 - 10.5 K/uL   RBC 3.81 (L) 3.87 - 5.11 MIL/uL   Hemoglobin 12.0 12.0 - 15.0 g/dL   HCT 91.4 (L) 78.2 - 95.6 %   MCV 89.5 78.0 - 100.0 fL   MCH 31.5 26.0 - 34.0 pg   MCHC 35.2 30.0 - 36.0 g/dL   RDW 21.3 08.6 - 57.8 %   Platelets 206 150 - 400 K/uL   Neutrophils Relative % 53 43 - 77 %   Neutro Abs 3.6 1.7 - 7.7 K/uL   Lymphocytes Relative 40 12 - 46 %   Lymphs Abs 2.7 0.7 - 4.0 K/uL   Monocytes Relative 6 3 - 12 %   Monocytes Absolute 0.4 0.1 - 1.0 K/uL   Eosinophils Relative  1 0 - 5 %   Eosinophils Absolute 0.1 0.0 - 0.7 K/uL   Basophils Relative 0 0 - 1 %   Basophils Absolute 0.0 0.0 - 0.1 K/uL  Urinalysis, Routine w reflex microscopic   Collection Time: 02/03/15 11:23 PM  Result Value Ref Range   Color, Urine STRAW (A) YELLOW   APPearance CLEAR CLEAR   Specific Gravity, Urine <1.005 (L) 1.005 - 1.030   pH 6.0 5.0 - 8.0   Glucose, UA NEGATIVE NEGATIVE mg/dL   Hgb urine dipstick LARGE (A) NEGATIVE   Bilirubin Urine NEGATIVE NEGATIVE   Ketones, ur NEGATIVE NEGATIVE mg/dL   Protein, ur NEGATIVE NEGATIVE mg/dL   Urobilinogen, UA 0.2 0.0 - 1.0 mg/dL   Nitrite NEGATIVE NEGATIVE   Leukocytes, UA TRACE (A) NEGATIVE  Urine microscopic-add on   Collection Time: 02/03/15 11:23 PM  Result Value Ref Range   Squamous Epithelial / LPF FEW (A) RARE   WBC, UA 0-2 <3 WBC/hpf   RBC / HPF 0-2 <3 RBC/hpf   Bacteria, UA RARE RARE        Assessment & Plan:  A:    Vaginal bleeding in pregnancy, ? [facility] P:     F/U as scheduled for ne OB   CRESENZO-DISHMAN,Eliodoro Gullett CNM 02/04/2015 2:37 PM

## 2015-02-04 NOTE — Discharge Instructions (Signed)
°  Call Dr. Rayna SextonFerguson's office tomorrow to let them know that you were here with vaginal bleeding and need a follow up appointment.

## 2015-02-04 NOTE — Telephone Encounter (Signed)
Spoke with pt's husband. Pt was seen at [facility] yesterday because she quit breathing. Her breathing is fine today, but she is having some spotting and low back pain. Pt has a scheduled appt for 3/16 but needs to be seen sooner. Husband voiced understanding. Call transferred to front desk for appt. JSY

## 2015-02-18 ENCOUNTER — Encounter: Payer: Self-pay | Admitting: Advanced Practice Midwife

## 2015-02-18 ENCOUNTER — Telehealth: Payer: Self-pay | Admitting: *Deleted

## 2015-02-18 ENCOUNTER — Ambulatory Visit (INDEPENDENT_AMBULATORY_CARE_PROVIDER_SITE_OTHER): Payer: Commercial Indemnity | Admitting: Advanced Practice Midwife

## 2015-02-18 VITALS — BP 110/72 | HR 76 | Wt 167.0 lb

## 2015-02-18 DIAGNOSIS — Z1389 Encounter for screening for other disorder: Secondary | ICD-10-CM

## 2015-02-18 DIAGNOSIS — Z369 Encounter for antenatal screening, unspecified: Secondary | ICD-10-CM

## 2015-02-18 DIAGNOSIS — Z331 Pregnant state, incidental: Secondary | ICD-10-CM

## 2015-02-18 DIAGNOSIS — Z3492 Encounter for supervision of normal pregnancy, unspecified, second trimester: Secondary | ICD-10-CM

## 2015-02-18 DIAGNOSIS — Z3490 Encounter for supervision of normal pregnancy, unspecified, unspecified trimester: Secondary | ICD-10-CM | POA: Insufficient documentation

## 2015-02-18 DIAGNOSIS — Z0283 Encounter for blood-alcohol and blood-drug test: Secondary | ICD-10-CM

## 2015-02-18 LAB — OB RESULTS CONSOLE GC/CHLAMYDIA
CHLAMYDIA, DNA PROBE: NEGATIVE
GC PROBE AMP, GENITAL: NEGATIVE

## 2015-02-18 LAB — POCT URINALYSIS DIPSTICK
Glucose, UA: NEGATIVE
KETONES UA: NEGATIVE
LEUKOCYTES UA: NEGATIVE
Nitrite, UA: NEGATIVE
PROTEIN UA: NEGATIVE

## 2015-02-18 MED ORDER — CONCEPT DHA 53.5-38-1 MG PO CAPS: 1.0000 | ORAL_CAPSULE | Freq: Every day | ORAL | Status: AC

## 2015-02-18 MED ORDER — PRENATAL VITAMINS 0.8 MG PO TABS: 1.0000 | ORAL_TABLET | Freq: Every day | ORAL | Status: DC

## 2015-02-18 NOTE — Telephone Encounter (Signed)
Requesting refill on PNV. Pt has [employer].

## 2015-02-18 NOTE — Progress Notes (Signed)
  Subjective:    Kim Marks is a [id number] 7645w3d being seen today for her first obstetrical visit.  Her obstetrical history is significant for ectopic pregnancy, 1st trimester SAB, and 4 uncomplicated vaginal deliveries.  Pregnancy history fully reviewed.  Patient reports ongoing brown dc sometimes when she wipes.  She had bright red bleeding a few weeks ago, and was presumed to have [facility]  Discharge is less  now.  Filed Vitals:   02/18/15 0850  BP: 110/72  Pulse: 76  Weight: 167 lb (75.751 kg)    HISTORY: OB History  Gravida Para Term Preterm AB SAB TAB Ectopic Multiple Living  7 4 0 0 2 1 0 1 0 4     # Outcome Date GA Lbr Len/2nd Weight Sex Delivery Anes PTL Lv  7 Current           6 SAB           5 Ectopic           4 Para     F Vag-Spont   Y  3 Para     M Vag-Spont   Y  2 Para     F Vag-Spont   Y  1 Para     F Vag-Spont   Y     Past Medical History  Diagnosis Date  . Miscarriage   . Tubal ectopic pregnancy    Past Surgical History  Procedure Laterality Date  . No past surgeries     Family History  Problem Relation Age of Onset  . Heart disease Mother      Exam                                      System:     Skin: normal coloration and turgor, no rashes    Neurologic: oriented, normal, normal mood   Extremities: normal strength, tone, and muscle mass   HEENT PERRLA   Mouth/Teeth mucous membranes moist, normal dentition   Neck supple and no masses   Cardiovascular: regular rate and rhythm   Respiratory:  appears well, vitals normal, no respiratory distress, acyanotic   Abdomen: soft, non-tender;  FHR: 150          Assessment:    Pregnancy: [id number] Patient Active Problem List   Diagnosis Date Noted  . Supervision of normal pregnancy 02/18/2015  . 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 Knee contusion 04/11/2012  . Knee pain 04/11/2012        Plan:     Initial labs drawn. Continue prenatal vitamins  Problem list  reviewed and updated  Reviewed n/v relief measures and warning s/s to report  Reviewed recommended weight gain based on pre-gravid BMI  Encouraged well-balanced diet Genetic Screening discussed Integrated Screen: declined.  Ultrasound discussed; fetal survey: requested.  Follow up in 3 weeks for LROB.  Kim Marks,Kim Marks 02/18/2015

## 2015-02-19 LAB — URINE CULTURE: ORGANISM ID, BACTERIA: NO GROWTH

## 2015-02-20 LAB — GC/CHLAMYDIA PROBE AMP
Chlamydia trachomatis, NAA: NEGATIVE
Neisseria gonorrhoeae by PCR: NEGATIVE

## 2015-03-11 ENCOUNTER — Encounter: Payer: Self-pay | Admitting: Women's Health

## 2015-03-11 ENCOUNTER — Ambulatory Visit (INDEPENDENT_AMBULATORY_CARE_PROVIDER_SITE_OTHER): Payer: Commercial Indemnity | Admitting: Women's Health

## 2015-03-11 VITALS — BP 104/64 | HR 64 | Wt 173.0 lb

## 2015-03-11 DIAGNOSIS — Z363 Encounter for antenatal screening for malformations: Secondary | ICD-10-CM

## 2015-03-11 DIAGNOSIS — Z3492 Encounter for supervision of normal pregnancy, unspecified, second trimester: Secondary | ICD-10-CM

## 2015-03-11 DIAGNOSIS — Z331 Pregnant state, incidental: Secondary | ICD-10-CM

## 2015-03-11 DIAGNOSIS — Z1389 Encounter for screening for other disorder: Secondary | ICD-10-CM

## 2015-03-11 LAB — POCT URINALYSIS DIPSTICK
Blood, UA: NEGATIVE
Glucose, UA: NEGATIVE
Ketones, UA: NEGATIVE
Leukocytes, UA: NEGATIVE
Nitrite, UA: NEGATIVE
PROTEIN UA: NEGATIVE

## 2015-03-11 NOTE — Patient Instructions (Signed)
Second Trimester of Pregnancy The second trimester is from week 13 through week 28, months 4 through 6. The second trimester is often a time when you feel your best. Your body has also adjusted to being pregnant, and you begin to feel better physically. Usually, morning sickness has lessened or quit completely, you may have more energy, and you may have an increase in appetite. The second trimester is also a time when the fetus is growing rapidly. At the end of the sixth month, the fetus is about 9 inches long and weighs about 1 pounds. You will likely begin to feel the baby move (quickening) between 18 and 20 weeks of the pregnancy. BODY CHANGES Your body goes through many changes during pregnancy. The changes vary from woman to woman.   Your weight will continue to increase. You will notice your lower abdomen bulging out.  You may begin to get stretch marks on your hips, abdomen, and breasts.  You may develop headaches that can be relieved by medicines approved by your health care provider.  You may urinate more often because the fetus is pressing on your bladder.  You may develop or continue to have heartburn as a result of your pregnancy.  You may develop constipation because certain hormones are causing the muscles that push waste through your intestines to slow down.  You may develop hemorrhoids or swollen, bulging veins (varicose veins).  You may have back pain because of the weight gain and pregnancy hormones relaxing your joints between the bones in your pelvis and as a result of a shift in weight and the muscles that support your balance.  Your breasts will continue to grow and be tender.  Your gums may bleed and may be sensitive to brushing and flossing.  Dark spots or blotches (chloasma, mask of pregnancy) may develop on your face. This will likely fade after the baby is born.  A dark line from your belly button to the pubic area (linea nigra) may appear. This will likely fade  after the baby is born.  You may have changes in your hair. These can include thickening of your hair, rapid growth, and changes in texture. Some women also have hair loss during or after pregnancy, or hair that feels dry or thin. Your hair will most likely return to normal after your baby is born. WHAT TO EXPECT AT YOUR PRENATAL VISITS During a routine prenatal visit:  You will be weighed to make sure you and the fetus are growing normally.  Your blood pressure will be taken.  Your abdomen will be measured to track your baby's growth.  The fetal heartbeat will be listened to.  Any test results from the previous visit will be discussed. Your health care provider may ask you:  How you are feeling.  If you are feeling the baby move.  If you have had any abnormal symptoms, such as leaking fluid, bleeding, severe headaches, or abdominal cramping.  If you have any questions. Other tests that may be performed during your second trimester include:  Blood tests that check for:  Low iron levels (anemia).  Gestational diabetes (between 24 and 28 weeks).  Rh antibodies.  Urine tests to check for infections, diabetes, or protein in the urine.  An ultrasound to confirm the proper growth and development of the baby.  An amniocentesis to check for possible genetic problems.  Fetal screens for spina bifida and Down syndrome. HOME CARE INSTRUCTIONS   Avoid all smoking, herbs, alcohol, and unprescribed   drugs. These chemicals affect the formation and growth of the baby.  Follow your health care provider's instructions regarding medicine use. There are medicines that are either safe or unsafe to take during pregnancy.  Exercise only as directed by your health care provider. Experiencing uterine cramps is a good sign to stop exercising.  Continue to eat regular, healthy meals.  Wear a good support bra for breast tenderness.  Do not use hot tubs, steam rooms, or saunas.  Wear your  seat belt at all times when driving.  Avoid raw meat, uncooked cheese, cat litter boxes, and soil used by cats. These carry germs that can cause birth defects in the baby.  Take your prenatal vitamins.  Try taking a stool softener (if your health care provider approves) if you develop constipation. Eat more high-fiber foods, such as fresh vegetables or fruit and whole grains. Drink plenty of fluids to keep your urine clear or pale yellow.  Take warm sitz baths to soothe any pain or discomfort caused by hemorrhoids. Use hemorrhoid cream if your health care provider approves.  If you develop varicose veins, wear support hose. Elevate your feet for 15 minutes, 3-4 times a day. Limit salt in your diet.  Avoid heavy lifting, wear low heel shoes, and practice good posture.  Rest with your legs elevated if you have leg cramps or low back pain.  Visit your dentist if you have not gone yet during your pregnancy. Use a soft toothbrush to brush your teeth and be gentle when you floss.  A sexual relationship may be continued unless your health care provider directs you otherwise.  Continue to go to all your prenatal visits as directed by your health care provider. SEEK MEDICAL CARE IF:   You have dizziness.  You have mild pelvic cramps, pelvic pressure, or nagging pain in the abdominal area.  You have persistent nausea, vomiting, or diarrhea.  You have a bad smelling vaginal discharge.  You have pain with urination. SEEK IMMEDIATE MEDICAL CARE IF:   You have a fever.  You are leaking fluid from your vagina.  You have spotting or bleeding from your vagina.  You have severe abdominal cramping or pain.  You have rapid weight gain or loss.  You have shortness of breath with chest pain.  You notice sudden or extreme swelling of your face, hands, ankles, feet, or legs.  You have not felt your baby move in over an hour.  You have severe headaches that do not go away with  medicine.  You have vision changes. Document Released: 11/15/2001 Document Revised: 11/26/2013 Document Reviewed: 01/22/2013 ExitCare Patient Information 2015 [employer]. This information is not intended to replace advice given to you by your health care provider. Make sure you discuss any questions you have with your health care provider.  

## 2015-03-11 NOTE — Progress Notes (Signed)
Low-risk OB appointment [id number] 1223w3d Estimated Date of Delivery: 08/16/15 BP 104/64 mmHg  Pulse 64  Wt 173 lb (78.472 kg)  LMP 11/20/2014 (Approximate)  BP, weight, and urine reviewed.  Refer to obstetrical flow sheet for FH & FHR.  No fm yet. Denies cramping, lof, vb, or uti s/s. Some RLP- reassured.  Reviewed warning s/s to report. Plan:  Continue routine obstetrical care  F/U in 3wks for OB appointment and anatomy u/s

## 2015-03-17 ENCOUNTER — Telehealth: Payer: Self-pay | Admitting: *Deleted

## 2015-03-17 NOTE — Telephone Encounter (Signed)
Pts husband states she is having a tooth ache and wants to know what she can take for it.  I informed him she can take Tylenol and advised making an appointment with her dentist.  Pts husband verbalized understanding.

## 2015-03-24 ENCOUNTER — Ambulatory Visit (INDEPENDENT_AMBULATORY_CARE_PROVIDER_SITE_OTHER): Payer: Commercial Indemnity | Admitting: Women's Health

## 2015-03-24 ENCOUNTER — Telehealth: Payer: Self-pay | Admitting: Advanced Practice Midwife

## 2015-03-24 VITALS — BP 100/62 | HR 80 | Wt 171.0 lb

## 2015-03-24 DIAGNOSIS — B373 Candidiasis of vulva and vagina: Secondary | ICD-10-CM | POA: Diagnosis not present

## 2015-03-24 DIAGNOSIS — O26892 Other specified pregnancy related conditions, second trimester: Secondary | ICD-10-CM

## 2015-03-24 DIAGNOSIS — Z1389 Encounter for screening for other disorder: Secondary | ICD-10-CM

## 2015-03-24 DIAGNOSIS — Z331 Pregnant state, incidental: Secondary | ICD-10-CM

## 2015-03-24 DIAGNOSIS — B3731 Acute candidiasis of vulva and vagina: Secondary | ICD-10-CM

## 2015-03-24 DIAGNOSIS — Z3492 Encounter for supervision of normal pregnancy, unspecified, second trimester: Secondary | ICD-10-CM

## 2015-03-24 DIAGNOSIS — N898 Other specified noninflammatory disorders of vagina: Secondary | ICD-10-CM

## 2015-03-24 LAB — POCT URINALYSIS DIPSTICK
Blood, UA: NEGATIVE
GLUCOSE UA: NEGATIVE
Ketones, UA: NEGATIVE
LEUKOCYTES UA: NEGATIVE
NITRITE UA: NEGATIVE
Protein, UA: NEGATIVE

## 2015-03-24 LAB — POCT WET PREP (WET MOUNT): Clue Cells Wet Prep Whiff POC: NEGATIVE

## 2015-03-24 MED ORDER — TERCONAZOLE 0.4 % VA CREA: 1.0000 | TOPICAL_CREAM | Freq: Every day | VAGINAL | Status: DC

## 2015-03-24 NOTE — Progress Notes (Signed)
Work-in Low-risk OB appointment [id number] 3553w2d Estimated Date of Delivery: 08/16/15 BP 100/62 mmHg  Pulse 80  Wt 171 lb (77.565 kg)  LMP 11/20/2014 (Approximate)  BP, weight, and urine reviewed.  Refer to obstetrical flow sheet for FH & FHR.  Reports good fm.  Denies regular uc's, lof, vb, or uti s/s. Yellow d/c w/ itching x 4d, some cramping.  Spec exam: posterior cx, unable to visualize os, mod amt thick clumpy yellowish nonodorous d/c, wet prep+yeast, rx terazole7 SVE: LTC Reviewed ptl s/s, fm. Plan:  Continue routine obstetrical care  F/U in 1wk as scheduled for OB appointment and anatomy u/s

## 2015-03-24 NOTE — Patient Instructions (Signed)
Call the office [phone]) or [facility] if:  You begin to have strong, frequent contractions  Your water breaks.  Sometimes it is a big gush of fluid, sometimes it is just a trickle that keeps getting your panties wet or running down your legs  You have vaginal bleeding.  It is normal to have a small amount of spotting if your cervix was checked.  Vaginitis monilisica (Monilial Vaginitis) La vaginitis es una inflamacin (irritacin, hinchazn) de la vagina y la vulva. Esta no es una enfermedad de transmisin sexual.  CAUSAS Este tipo de vaginitis lo causa un hongo (candida) que normalmente se encuentra en la vagina. El hongo candida se ha desarrollado hasta el punto de ocasionar problemas en el equilibrio qumico. SNTOMAS 4. Secrecin vaginal espesa y blanca. 5. Hinchazn, picazn, enrojecimiento e inflamacin de la vagina y en algunos casos de los labios vaginales (vulva). 6. Ardor o dolor al ConocoPhillipsorinar. 7. Dolor en las [place] Travelersrelaciones sexuales. DIAGNSTICO Los factores que favorecen la vaginitis moniliasica son:  Everlean Pattersontapas de virginidad y postmenopusicas.  Embarazo.  Infecciones.  Sentir cansancio, estar enferma o estresada, especialmente si ya ha sufrido este problema en el pasado.  Diabetes Buen control ayudar a disminur la probabilidad.  Pldoras anticonceptivas  Ropa interior [place] ajustada.  El uso de espumas de bao, aerosoles femeninos duchas vaginales o tampones con desodorante.  Algunos antibiticos (medicamentos que destruyen grmenes).  Si contrae alguna enfermedad puede sufrir recurrencias espordicas. TRATAMIENTO El profesional que lo asiste prescribir medicamentos.  Hay diferentes tipos de cremas y supositorios vaginales que tratan especficamente la vaginitis monilisica. Para infecciones por hongos recurrentes, utilice un supositorio o crema en la vagina dos veces por semana, o segn se le indique.  Tambin podrn utilizarse cremas con corticoides o  anti monilisicas para la picazn o la irritacin de la vulva. Consulte con el profesional que la asiste.  Si la crema no da resultado, podr aplicarse en la vagina una solucin con azul de metileno.  El consumo de yogur puede prevenir este tipo de vaginitis. INSTRUCCIONES PARA EL CUIDADO DOMICILIARIO  Tome todos los medicamentos tal como se le indic.  No mantenga relaciones sexuales hasta que el tratamiento se haya completado, o segn las indicaciones del profesional que la asiste.  Tome baos de asiento tibios.  No se aplique duchas vaginales.  No utilice tampones, especialmente los perfumados.  Use ropa interior de algodn  [employer] los pantalones ajustados y las medias tipo panty.  Comunique a sus compaeros sexuales que sufre una infeccin por hongos. Ellos deben concurrir para un control mdico si tienen sntomas como una urticaria leve o picazn.  Sus compaeros sexuales deben tratarse tambin si la infeccin es difcil de Pharmacologisteliminar.  Practique el sexo seguro - use condones  Algunos medicamentos vaginales ocasionan fallas en los condones de ltex. Los medicamentos vaginales que pueden daar los condones son:  ChiropodistCrema cleocina  Butoconazole (Femstat)  Terconazole (Terazol) supositorios vaginales  Miconazole (Monistat) (es un medicamento de venta libre) SOLICITE ATENCIN MDICA SI:  Daphane ShepherdUsted tiene una temperatura oral de ms de 38,9 C (102 F).  Si la infeccin empeora luego de 2 [address] Streetdas de tratamiento.  Si la infeccin no mejora luego de 3 [address]das de tratamiento.  Aparecen ampollas en o alrededor de la vagina.  Si aparece una hemorragia vaginal y no es el momento del perodo.  Siente dolor al ConocoPhillipsorinar.  Presenta problemas intestinales.  Tiene dolor durante las [employer] [place] Travelersrelaciones sexuales. Document Released: 08/31/2005 Document Revised: 02/13/2012 ExitCare Patient Information 2015  [employer]. This information is not intended to replace advice given to you by your health  care provider. Make sure you discuss any questions you have with your health care provider.

## 2015-03-24 NOTE — Telephone Encounter (Signed)
Pt c/o yellowish vaginal discharge. Pt to be seen today at 3:30 pm for evaluation.

## 2015-04-02 ENCOUNTER — Encounter: Payer: Self-pay | Admitting: Obstetrics and Gynecology

## 2015-04-02 ENCOUNTER — Ambulatory Visit (INDEPENDENT_AMBULATORY_CARE_PROVIDER_SITE_OTHER): Payer: Commercial Indemnity

## 2015-04-02 ENCOUNTER — Ambulatory Visit (INDEPENDENT_AMBULATORY_CARE_PROVIDER_SITE_OTHER): Payer: Commercial Indemnity | Admitting: Obstetrics and Gynecology

## 2015-04-02 VITALS — BP 100/70 | HR 84 | Temp 98.6°F

## 2015-04-02 DIAGNOSIS — Z36 Encounter for antenatal screening of mother: Secondary | ICD-10-CM | POA: Diagnosis not present

## 2015-04-02 DIAGNOSIS — Z331 Pregnant state, incidental: Secondary | ICD-10-CM

## 2015-04-02 DIAGNOSIS — Z3492 Encounter for supervision of normal pregnancy, unspecified, second trimester: Secondary | ICD-10-CM

## 2015-04-02 DIAGNOSIS — Z363 Encounter for antenatal screening for malformations: Secondary | ICD-10-CM

## 2015-04-02 DIAGNOSIS — Z1389 Encounter for screening for other disorder: Secondary | ICD-10-CM

## 2015-04-02 DIAGNOSIS — J069 Acute upper respiratory infection, unspecified: Secondary | ICD-10-CM

## 2015-04-02 LAB — POCT URINALYSIS DIPSTICK
Blood, UA: NEGATIVE
GLUCOSE UA: NEGATIVE
Ketones, UA: NEGATIVE
LEUKOCYTES UA: NEGATIVE
NITRITE UA: NEGATIVE
Protein, UA: NEGATIVE

## 2015-04-02 MED ORDER — CETIRIZINE HCL 10 MG PO TABS: 10.0000 mg | ORAL_TABLET | Freq: Every day | ORAL | Status: DC

## 2015-04-02 NOTE — Progress Notes (Signed)
US 20+4WKS measurements c/w dates,sdp of fluid 3.9cm,cx 2.8, ant pl gr 0,fht 153,efw 403g,normal ov's bilat,anatomy scan complete,no obvious abnormalities seen.

## 2015-04-02 NOTE — Progress Notes (Signed)
Patient ID: Kim Marks, female   DOB: Nov 05, 1981, 34 y.o.   MRN: [id number]   This chart was scribed for Tilda BurrowJohn V. Laverta Harnisch, MD by Carl Bestelina Holson, ED Scribe. This patient was seen in Room 2 and the patient's care was started at 9:48 AM.   [id number] 3836w4d Estimated Date of Delivery: 08/16/15  Blood pressure 100/70, pulse 84, temperature 98.6 F (37 C), last menstrual period 11/20/2014.   refer to the ob flow sheet for FH and FHR, also BP, Wt, Urine results: negative  Patient reports +good fetal movement, denies any bleeding and no rupture of membranes symptoms or regular contractions. Patient complaints: She has been complaining of subjective fever, cough, sore throat, and sinus congestion that started three days ago.  She has not measured her temperature.  She had one episode of vomiting.  She has been taking Benadryl with no relief to her symptoms.  She has a history of sinusitis.    Physical Exam: Throat - no tonsillar edema Ear - TMs normal Pulmonary - good air movement throughout; clear to auscultation bilaterally   Questions were answered. Assessment: 2936w4d, [id number], URI, no asthma, no otitis. Plan:  Continued routine obstetrical care, Zyrtec F/u in 4 weeks

## 2015-04-02 NOTE — Progress Notes (Signed)
Cough, runny nose, headaches, sore throat, fever since Monday.

## 2015-04-30 ENCOUNTER — Encounter: Payer: Self-pay | Admitting: Advanced Practice Midwife

## 2015-04-30 ENCOUNTER — Ambulatory Visit (INDEPENDENT_AMBULATORY_CARE_PROVIDER_SITE_OTHER): Payer: Commercial Indemnity | Admitting: Advanced Practice Midwife

## 2015-04-30 VITALS — BP 100/72 | HR 64 | Wt 176.5 lb

## 2015-04-30 DIAGNOSIS — Z3492 Encounter for supervision of normal pregnancy, unspecified, second trimester: Secondary | ICD-10-CM

## 2015-04-30 DIAGNOSIS — Z1389 Encounter for screening for other disorder: Secondary | ICD-10-CM

## 2015-04-30 DIAGNOSIS — Z331 Pregnant state, incidental: Secondary | ICD-10-CM

## 2015-04-30 LAB — POCT URINALYSIS DIPSTICK
Blood, UA: NEGATIVE
Glucose, UA: NEGATIVE
Ketones, UA: NEGATIVE
Leukocytes, UA: NEGATIVE
NITRITE UA: NEGATIVE
Protein, UA: NEGATIVE

## 2015-04-30 MED ORDER — CETIRIZINE HCL 10 MG PO CAPS: 1.0000 | ORAL_CAPSULE | Freq: Every day | ORAL | Status: DC

## 2015-04-30 MED ORDER — CETIRIZINE HCL 10 MG PO TABS
10.0000 mg | ORAL_TABLET | Freq: Every day | ORAL | Status: DC
Start: 2015-04-30 — End: 2015-08-06

## 2015-04-30 NOTE — Progress Notes (Signed)
[id number] 6530w4d Estimated Date of Delivery: 08/16/15  Last menstrual period 11/20/2014.   BP weight and urine results all reviewed and noted.  Please refer to the obstetrical flow sheet for the fundal height and fetal heart rate documentation:  Patient reports good fetal movement, denies any bleeding and no rupture of membranes symptoms or regular contractions. Patient is without complaints. All questions were answered.  Plan:  Continued routine obstetrical care,   Follow up in 3 weeks for OB appointment, PN2

## 2015-04-30 NOTE — Patient Instructions (Signed)

## 2015-04-30 NOTE — Progress Notes (Signed)
Pt denies any problems or concerns at this time. Pt needs a refill on her PNV

## 2015-05-21 ENCOUNTER — Other Ambulatory Visit: Payer: Commercial Indemnity

## 2015-05-21 ENCOUNTER — Ambulatory Visit (INDEPENDENT_AMBULATORY_CARE_PROVIDER_SITE_OTHER): Payer: Commercial Indemnity | Admitting: Advanced Practice Midwife

## 2015-05-21 VITALS — BP 102/60 | HR 78 | Wt 178.0 lb

## 2015-05-21 DIAGNOSIS — Z369 Encounter for antenatal screening, unspecified: Secondary | ICD-10-CM

## 2015-05-21 DIAGNOSIS — Z131 Encounter for screening for diabetes mellitus: Secondary | ICD-10-CM | POA: Diagnosis not present

## 2015-05-21 DIAGNOSIS — Z1389 Encounter for screening for other disorder: Secondary | ICD-10-CM

## 2015-05-21 DIAGNOSIS — Z3492 Encounter for supervision of normal pregnancy, unspecified, second trimester: Secondary | ICD-10-CM

## 2015-05-21 DIAGNOSIS — R42 Dizziness and giddiness: Secondary | ICD-10-CM

## 2015-05-21 DIAGNOSIS — Z331 Pregnant state, incidental: Secondary | ICD-10-CM | POA: Diagnosis not present

## 2015-05-21 LAB — OB RESULTS CONSOLE RPR: RPR: NONREACTIVE

## 2015-05-21 LAB — POCT URINALYSIS DIPSTICK
Glucose, UA: NEGATIVE
Ketones, UA: NEGATIVE
LEUKOCYTES UA: NEGATIVE
Nitrite, UA: NEGATIVE
PROTEIN UA: NEGATIVE
RBC UA: NEGATIVE

## 2015-05-21 LAB — GLUCOSE, POCT (MANUAL RESULT ENTRY): POC Glucose: 188 mg/dl — AB (ref 70–99)

## 2015-05-21 LAB — OB RESULTS CONSOLE HIV ANTIBODY (ROUTINE TESTING)
HIV: NONREACTIVE
HIV: NONREACTIVE

## 2015-05-21 NOTE — Progress Notes (Addendum)
[id number] [redacted]w[redacted]d Estimated Date of Delivery: 08/16/15  Blood pressure 102/60, pulse 78, weight 178 lb (80.74 kg), last menstrual period 11/20/2014.   BP weight and urine results all reviewed and noted.  Please refer to the obstetrical flow sheet for the fundal height and fetal heart rate documentation:  Patient reports good fetal movement, denies any bleeding and no rupture of membranes symptoms or regular contractions. Patient is without complaints. Feels dizzy since doing gtt.  Fingerstick BS 188.  All questions were answered.  Plan:  Continued routine obstetrical care, PN2 today  Follow up in 3 weeks for OB appointment,

## 2015-05-21 NOTE — Addendum Note (Signed)
Addended by: Criss Alvine on: 05/21/2015 09:48 AM   Modules accepted: Orders

## 2015-05-22 LAB — CBC
Hematocrit: 34.9 % (ref 34.0–46.6)
Hemoglobin: 11.8 g/dL (ref 11.1–15.9)
MCH: 32 pg (ref 26.6–33.0)
MCHC: 33.8 g/dL (ref 31.5–35.7)
MCV: 95 fL (ref 79–97)
Platelets: 188 10*3/uL (ref 150–379)
RBC: 3.69 x10E6/uL — ABNORMAL LOW (ref 3.77–5.28)
RDW: 14.3 % (ref 12.3–15.4)
WBC: 6.3 10*3/uL (ref 3.4–10.8)

## 2015-05-22 LAB — GLUCOSE TOLERANCE, 2 HOURS W/ 1HR
Glucose, 1 hour: 113 mg/dL (ref 65–179)
Glucose, 2 hour: 88 mg/dL (ref 65–152)
Glucose, Fasting: 77 mg/dL (ref 65–91)

## 2015-05-22 LAB — HSV 2 ANTIBODY, IGG: HSV 2 Glycoprotein G Ab, IgG: <0.91 index (ref 0.00–0.90)

## 2015-05-22 LAB — ANTIBODY SCREEN: ANTIBODY SCREEN: NEGATIVE

## 2015-05-22 LAB — RPR: RPR Ser Ql: NONREACTIVE

## 2015-05-22 LAB — HIV ANTIBODY (ROUTINE TESTING W REFLEX): HIV Screen 4th Generation wRfx: NONREACTIVE

## 2015-06-11 ENCOUNTER — Encounter: Payer: Commercial Indemnity | Admitting: Obstetrics and Gynecology

## 2015-06-17 ENCOUNTER — Encounter: Payer: Self-pay | Admitting: Obstetrics and Gynecology

## 2015-06-17 ENCOUNTER — Ambulatory Visit (INDEPENDENT_AMBULATORY_CARE_PROVIDER_SITE_OTHER): Payer: Commercial Indemnity | Admitting: Obstetrics and Gynecology

## 2015-06-17 VITALS — BP 116/72 | HR 72 | Wt 183.0 lb

## 2015-06-17 DIAGNOSIS — Z331 Pregnant state, incidental: Secondary | ICD-10-CM

## 2015-06-17 DIAGNOSIS — Z3402 Encounter for supervision of normal first pregnancy, second trimester: Secondary | ICD-10-CM

## 2015-06-17 DIAGNOSIS — Z1389 Encounter for screening for other disorder: Secondary | ICD-10-CM

## 2015-06-17 LAB — POCT URINALYSIS DIPSTICK
Blood, UA: NEGATIVE
Glucose, UA: NEGATIVE
KETONES UA: NEGATIVE
Leukocytes, UA: NEGATIVE
NITRITE UA: NEGATIVE
PROTEIN UA: NEGATIVE

## 2015-06-17 NOTE — Progress Notes (Signed)
Patient ID: Kim Marks, female   DOB: 10-12-1981, 34 y.o.   MRN: [id number]  [id number] 2245w3d Estimated Date of Delivery: 08/16/15  Blood pressure 116/72, pulse 72, weight 183 lb (83.008 kg), last menstrual period 11/20/2014.   refer to the ob flow sheet for FH and FHR, also BP, Wt, Urine results:notable for negative   Patient reports  + good fetal movement, denies any bleeding and no rupture of membranes symptoms or regular contractions. Patient complaints: Patient notes occasional, 1-2 contractions per day that are irregular. Otherwise, she has no complaints.  FHR: 149 bpm FH: 33 cm  Questions were answered. Assessment: Pregnancy 3945w3d, LROB, pt reassured.  Plan:  Continued routine obstetrical care  F/u in 2 weeks for pnx care    This chart was SCRIBED for Kim BachJohn Zarea Diesing, MD by Ronney LionSuzanne Le, ED Scribe. This patient was seen in room 1 and the patient's care was started at 11:22 AM.    I personally performed the services described in this documentation, which was SCRIBED in my presence. The recorded information has been reviewed and considered accurate. It has been edited as necessary during review. Tilda BurrowFERGUSON,Knut Rondinelli V, MD

## 2015-06-17 NOTE — Progress Notes (Signed)
Pt states that the only concerns are the contractions she has been having.

## 2015-07-01 ENCOUNTER — Ambulatory Visit (INDEPENDENT_AMBULATORY_CARE_PROVIDER_SITE_OTHER): Payer: Commercial Indemnity | Admitting: Advanced Practice Midwife

## 2015-07-01 ENCOUNTER — Encounter: Payer: Self-pay | Admitting: Advanced Practice Midwife

## 2015-07-01 VITALS — BP 108/70 | HR 88 | Wt 185.0 lb

## 2015-07-01 DIAGNOSIS — Z331 Pregnant state, incidental: Secondary | ICD-10-CM

## 2015-07-01 DIAGNOSIS — Z1389 Encounter for screening for other disorder: Secondary | ICD-10-CM

## 2015-07-01 DIAGNOSIS — Z3493 Encounter for supervision of normal pregnancy, unspecified, third trimester: Secondary | ICD-10-CM

## 2015-07-01 LAB — POCT URINALYSIS DIPSTICK
GLUCOSE UA: NEGATIVE
Ketones, UA: NEGATIVE
LEUKOCYTES UA: NEGATIVE
Nitrite, UA: NEGATIVE
PROTEIN UA: NEGATIVE

## 2015-07-01 NOTE — Progress Notes (Signed)
[id number] [redacted]w[redacted]d Estimated Date of Delivery: 08/16/15  Last menstrual period 11/20/2014.   BP weight and urine results all reviewed and noted.  Please refer to the obstetrical flow sheet for the fundal height and fetal heart rate documentation:  Patient reports good fetal movement, denies any bleeding and no rupture of membranes symptoms or regular contractions. Patient is without complaints. All questions were answered.  Plan:  Continued routine obstetrical care,   Follow up in 2 weeks for OB appointment,

## 2015-07-15 ENCOUNTER — Ambulatory Visit (INDEPENDENT_AMBULATORY_CARE_PROVIDER_SITE_OTHER): Payer: Commercial Indemnity | Admitting: Advanced Practice Midwife

## 2015-07-15 ENCOUNTER — Encounter: Payer: Self-pay | Admitting: Advanced Practice Midwife

## 2015-07-15 VITALS — BP 100/70 | HR 72 | Wt 186.0 lb

## 2015-07-15 DIAGNOSIS — Z331 Pregnant state, incidental: Secondary | ICD-10-CM

## 2015-07-15 DIAGNOSIS — Z3493 Encounter for supervision of normal pregnancy, unspecified, third trimester: Secondary | ICD-10-CM

## 2015-07-15 DIAGNOSIS — Z1389 Encounter for screening for other disorder: Secondary | ICD-10-CM

## 2015-07-15 LAB — POCT URINALYSIS DIPSTICK
Blood, UA: NEGATIVE
GLUCOSE UA: NEGATIVE
Ketones, UA: NEGATIVE
NITRITE UA: NEGATIVE
Protein, UA: NEGATIVE

## 2015-07-15 NOTE — Progress Notes (Signed)
[id number] [redacted]w[redacted]d Estimated Date of Delivery: 08/16/15  Weight 186 lb (84.369 kg), last menstrual period 11/20/2014.   BP weight and urine results all reviewed and noted.  Please refer to the obstetrical flow sheet for the fundal height and fetal heart rate documentation:  Patient reports good fetal movement, denies any bleeding and no rupture of membranes symptoms or regular contractions. Patient is without complaints. All questions were answered.  Plan:  Continued routine obstetrical care,   Follow up in 2 weeks for OB appointment,

## 2015-07-29 ENCOUNTER — Ambulatory Visit (INDEPENDENT_AMBULATORY_CARE_PROVIDER_SITE_OTHER): Payer: Commercial Indemnity | Admitting: Advanced Practice Midwife

## 2015-07-29 ENCOUNTER — Encounter: Payer: Self-pay | Admitting: Advanced Practice Midwife

## 2015-07-29 VITALS — BP 100/60 | HR 80 | Wt 191.0 lb

## 2015-07-29 DIAGNOSIS — Z3493 Encounter for supervision of normal pregnancy, unspecified, third trimester: Secondary | ICD-10-CM

## 2015-07-29 DIAGNOSIS — Z1389 Encounter for screening for other disorder: Secondary | ICD-10-CM

## 2015-07-29 DIAGNOSIS — Z331 Pregnant state, incidental: Secondary | ICD-10-CM

## 2015-07-29 DIAGNOSIS — Z1159 Encounter for screening for other viral diseases: Secondary | ICD-10-CM

## 2015-07-29 DIAGNOSIS — Z3685 Encounter for antenatal screening for Streptococcus B: Secondary | ICD-10-CM

## 2015-07-29 DIAGNOSIS — Z118 Encounter for screening for other infectious and parasitic diseases: Secondary | ICD-10-CM

## 2015-07-29 LAB — POCT URINALYSIS DIPSTICK
GLUCOSE UA: NEGATIVE
Ketones, UA: NEGATIVE
Leukocytes, UA: NEGATIVE
NITRITE UA: NEGATIVE
Protein, UA: NEGATIVE
RBC UA: NEGATIVE

## 2015-07-29 LAB — OB RESULTS CONSOLE GBS: GBS: NEGATIVE

## 2015-07-29 NOTE — Progress Notes (Signed)
[id number] [redacted]w[redacted]d Estimated Date of Delivery: 08/16/15  Weight 191 lb (86.637 kg), last menstrual period 11/20/2014.   BP weight and urine results all reviewed and noted.  Please refer to the obstetrical flow sheet for the fundal height and fetal heart rate documentation:  Patient reports good fetal movement, denies any bleeding and no rupture of membranes symptoms or regular contractions. Patient is without complaints. All questions were answered.  Plan:  Continued routine obstetrical care, GBS today  Follow up in 1 weeks for OB appointment,

## 2015-07-31 LAB — GC/CHLAMYDIA PROBE AMP
CHLAMYDIA, DNA PROBE: NEGATIVE
Neisseria gonorrhoeae by PCR: NEGATIVE

## 2015-08-02 LAB — CULTURE, BETA STREP (GROUP B ONLY): STREP GP B CULTURE: NEGATIVE

## 2015-08-06 ENCOUNTER — Encounter: Payer: Self-pay | Admitting: Advanced Practice Midwife

## 2015-08-06 ENCOUNTER — Ambulatory Visit (INDEPENDENT_AMBULATORY_CARE_PROVIDER_SITE_OTHER): Payer: Commercial Indemnity | Admitting: Advanced Practice Midwife

## 2015-08-06 VITALS — BP 110/70 | HR 76 | Wt 191.5 lb

## 2015-08-06 DIAGNOSIS — Z3493 Encounter for supervision of normal pregnancy, unspecified, third trimester: Secondary | ICD-10-CM

## 2015-08-06 DIAGNOSIS — Z331 Pregnant state, incidental: Secondary | ICD-10-CM

## 2015-08-06 DIAGNOSIS — Z1389 Encounter for screening for other disorder: Secondary | ICD-10-CM

## 2015-08-06 LAB — POCT URINALYSIS DIPSTICK
GLUCOSE UA: NEGATIVE
KETONES UA: NEGATIVE
Leukocytes, UA: NEGATIVE
Nitrite, UA: NEGATIVE
Protein, UA: NEGATIVE

## 2015-08-06 NOTE — Patient Instructions (Signed)

## 2015-08-06 NOTE — Progress Notes (Addendum)
[id number] [redacted]w[redacted]d Estimated Date of Delivery: 08/16/15  Last menstrual period 11/20/2014.   BP weight and urine results all reviewed and noted.  Please refer to the obstetrical flow sheet for the fundal height and fetal heart rate documentation:  Patient reports good fetal movement, denies any bleeding and no rupture of membranes symptoms or regular contractions. Has been having irregular contractions, mainly at night Patient is without complaints other than normal pregnancy complaints.   All questions were answered.  No orders of the defined types were placed in this encounter.    Plan:  Continued routine obstetrical care,   Return in about 1 week (around 08/13/2015) for LROB.

## 2015-08-13 ENCOUNTER — Ambulatory Visit (INDEPENDENT_AMBULATORY_CARE_PROVIDER_SITE_OTHER): Payer: Commercial Indemnity | Admitting: Advanced Practice Midwife

## 2015-08-13 ENCOUNTER — Encounter: Payer: Self-pay | Admitting: Advanced Practice Midwife

## 2015-08-13 VITALS — BP 100/60 | HR 80 | Wt 193.0 lb

## 2015-08-13 DIAGNOSIS — Z1389 Encounter for screening for other disorder: Secondary | ICD-10-CM

## 2015-08-13 DIAGNOSIS — Z331 Pregnant state, incidental: Secondary | ICD-10-CM

## 2015-08-13 DIAGNOSIS — Z3493 Encounter for supervision of normal pregnancy, unspecified, third trimester: Secondary | ICD-10-CM

## 2015-08-13 LAB — POCT URINALYSIS DIPSTICK
Blood, UA: NEGATIVE
GLUCOSE UA: NEGATIVE
KETONES UA: NEGATIVE
LEUKOCYTES UA: NEGATIVE
Nitrite, UA: NEGATIVE
Protein, UA: NEGATIVE

## 2015-08-13 NOTE — Progress Notes (Signed)
Pt states that she had a little spotting on Saturday and Sunday. Pt denies any other problems or concerns at this time.

## 2015-08-13 NOTE — Progress Notes (Signed)
[id number] [redacted]w[redacted]d Estimated Date of Delivery: 08/16/15  Last menstrual period 11/20/2014.   BP weight and urine results all reviewed and noted.  Please refer to the obstetrical flow sheet for the fundal height and fetal heart rate documentation:  Patient reports good fetal movement, denies any bleeding and no rupture of membranes symptoms or regular contractions. Patient is without complaints. All questions were answered.  Orders Placed This Encounter  Procedures  . POCT urinalysis dipstick    Plan:  Continued routine obstetrical care,   Return in about 1 week (around 08/20/2015) for LROB.

## 2015-08-20 ENCOUNTER — Encounter: Payer: Self-pay | Admitting: Advanced Practice Midwife

## 2015-08-20 ENCOUNTER — Encounter (HOSPITAL_COMMUNITY): Payer: Self-pay | Admitting: *Deleted

## 2015-08-20 ENCOUNTER — Ambulatory Visit (INDEPENDENT_AMBULATORY_CARE_PROVIDER_SITE_OTHER): Payer: Commercial Indemnity | Admitting: Advanced Practice Midwife

## 2015-08-20 ENCOUNTER — Telehealth (HOSPITAL_COMMUNITY): Payer: Self-pay | Admitting: *Deleted

## 2015-08-20 VITALS — BP 120/76 | HR 80 | Wt 194.0 lb

## 2015-08-20 DIAGNOSIS — Z1389 Encounter for screening for other disorder: Secondary | ICD-10-CM

## 2015-08-20 DIAGNOSIS — Z3493 Encounter for supervision of normal pregnancy, unspecified, third trimester: Secondary | ICD-10-CM | POA: Diagnosis not present

## 2015-08-20 DIAGNOSIS — O480 Post-term pregnancy: Secondary | ICD-10-CM | POA: Diagnosis not present

## 2015-08-20 DIAGNOSIS — Z331 Pregnant state, incidental: Secondary | ICD-10-CM

## 2015-08-20 NOTE — Progress Notes (Signed)
[id number] [redacted]w[redacted]d Estimated Date of Delivery: 08/16/15  Last menstrual period 11/20/2014.   BP weight and urine results all reviewed and noted.  Please refer to the obstetrical flow sheet for the fundal height and fetal heart rate documentation: NST reactive  Patient reports good fetal movement, denies any bleeding and no rupture of membranes symptoms or regular contractions. Patient is without complaints. All questions were answered.  Orders Placed This Encounter  Procedures  . POCT urinalysis dipstick    Plan:  Continued routine obstetrical care, IOL schedulded for 9/18/ @ 0730 if undelivered.   Return in about 6 weeks (around 10/01/2015), or post partum check up.

## 2015-08-20 NOTE — Progress Notes (Signed)
Pt states that she is in a lot of pain and contractions are irregular. Pt states that she is having some light spotting and a yellow discharge.

## 2015-08-20 NOTE — Telephone Encounter (Signed)
Preadmission screen  

## 2015-08-20 NOTE — Patient Instructions (Addendum)
If you are still pregnant on Sunday, 08/23/15 at 7:30 am, come to [facility] ([address] in [place], [place]) to start your induction!  Eat a light meal before you come.  Daisey Must!!  Braxton Hicks Contractions Contractions of the uterus can occur throughout pregnancy. Contractions are not always a sign that you are in labor.  WHAT ARE BRAXTON HICKS CONTRACTIONS?  Contractions that occur before labor are called Braxton Hicks contractions, or false labor. Toward the end of pregnancy (32-34 weeks), these contractions can develop more often and may become more forceful. This is not true labor because these contractions do not result in opening (dilatation) and thinning of the cervix. They are sometimes difficult to tell apart from true labor because these contractions can be forceful and people have different pain tolerances. You should not feel embarrassed if you go to the hospital with false labor. Sometimes, the only way to tell if you are in true labor is for your health care provider to look for changes in the cervix. If there are no prenatal problems or other health problems associated with the pregnancy, it is completely safe to be sent home with false labor and await the onset of true labor. HOW CAN YOU TELL THE DIFFERENCE BETWEEN TRUE AND FALSE LABOR? False Labor  The contractions of false labor are usually shorter and not as hard as those of true labor.   The contractions are usually irregular.   The contractions are often felt in the front of the lower abdomen and in the groin.   The contractions may go away when you walk around or change positions while lying down.   The contractions get weaker and are shorter lasting as time goes on.   The contractions do not usually become progressively stronger, regular, and closer together as with true labor.  True Labor  Contractions in true labor last 30-70 seconds, become very regular, usually become more intense,  and increase in frequency.   The contractions do not go away with walking.   The discomfort is usually felt in the top of the uterus and spreads to the lower abdomen and low back.   True labor can be determined by your health care provider with an exam. This will show that the cervix is dilating and getting thinner.  WHAT TO REMEMBER  Keep up with your usual exercises and follow other instructions given by your health care provider.   Take medicines as directed by your health care provider.   Keep your regular prenatal appointments.   Eat and drink lightly if you think you are going into labor.   If Braxton Hicks contractions are making you uncomfortable:   Change your position from lying down or resting to walking, or from walking to resting.   Sit and rest in a tub of warm water.   Drink 2-3 glasses of water. Dehydration may cause these contractions.   Do slow and deep breathing several times an hour.  WHEN SHOULD I SEEK IMMEDIATE MEDICAL CARE? Seek immediate medical care if:  Your contractions become stronger, more regular, and closer together.   You have fluid leaking or gushing from your vagina.   You have a fever.   You pass blood-tinged mucus.   You have vaginal bleeding.   You have continuous abdominal pain.   You have low back pain that you never had before.   You feel your baby's head pushing down and causing pelvic pressure.   Your baby is  not moving as much as it used to.  Document Released: 11/21/2005 Document Revised: 11/26/2013 Document Reviewed: 09/02/2013 [facility] Patient Information 2015 [place], [place]. This information is not intended to replace advice given to you by your health care provider. Make sure you discuss any questions you have with your health care provider.

## 2015-08-21 ENCOUNTER — Inpatient Hospital Stay (HOSPITAL_COMMUNITY)
Admission: AD | Admit: 2015-08-21 | Discharge: 2015-08-22 | DRG: 775 | Disposition: A | Discharge status: Home / Self Care | Payer: Managed Care, Other (non HMO) | Source: Ambulatory Visit | Attending: Family Medicine | Admitting: Family Medicine

## 2015-08-21 ENCOUNTER — Encounter (HOSPITAL_COMMUNITY): Payer: Self-pay | Admitting: *Deleted

## 2015-08-21 ENCOUNTER — Inpatient Hospital Stay (HOSPITAL_COMMUNITY): Payer: Managed Care, Other (non HMO) | Admitting: Anesthesiology

## 2015-08-21 DIAGNOSIS — IMO0001 Reserved for inherently not codable concepts without codable children: Secondary | ICD-10-CM

## 2015-08-21 DIAGNOSIS — Z3A40 40 weeks gestation of pregnancy: Secondary | ICD-10-CM | POA: Diagnosis present

## 2015-08-21 DIAGNOSIS — Z8249 Family history of ischemic heart disease and other diseases of the circulatory system: Secondary | ICD-10-CM

## 2015-08-21 LAB — AMNISURE RUPTURE OF MEMBRANE (ROM) NOT AT ARMC: AMNISURE: POSITIVE

## 2015-08-21 LAB — ABO/RH: ABO/RH(D): O POS

## 2015-08-21 LAB — CBC
HEMATOCRIT: 35.2 % — AB (ref 36.0–46.0)
Hemoglobin: 12.3 g/dL (ref 12.0–15.0)
MCH: 31.8 pg (ref 26.0–34.0)
MCHC: 34.9 g/dL (ref 30.0–36.0)
MCV: 91 fL (ref 78.0–100.0)
Platelets: 160 10*3/uL (ref 150–400)
RBC: 3.87 MIL/uL (ref 3.87–5.11)
RDW: 13.2 % (ref 11.5–15.5)
WBC: 8.9 10*3/uL (ref 4.0–10.5)

## 2015-08-21 LAB — RPR: RPR Ser Ql: NONREACTIVE

## 2015-08-21 LAB — TYPE AND SCREEN
ABO/RH(D): O POS
ANTIBODY SCREEN: NEGATIVE

## 2015-08-21 LAB — HEPATITIS B SURFACE ANTIGEN: Hepatitis B Surface Ag: NEGATIVE

## 2015-08-21 MED ORDER — PHENYLEPHRINE 40 MCG/ML (10ML) SYRINGE FOR IV PUSH (FOR BLOOD PRESSURE SUPPORT)
80.0000 ug | PREFILLED_SYRINGE | INTRAVENOUS | Status: DC | PRN
  Filled 2015-08-21: qty 20
  Filled 2015-08-21: qty 2

## 2015-08-21 MED ORDER — METHYLERGONOVINE MALEATE 0.2 MG/ML IJ SOLN: 0.2000 mg | INTRAMUSCULAR | Status: DC | PRN

## 2015-08-21 MED ORDER — DIPHENHYDRAMINE HCL 50 MG/ML IJ SOLN: 12.5000 mg | INTRAMUSCULAR | Status: DC | PRN

## 2015-08-21 MED ORDER — ZOLPIDEM TARTRATE 5 MG PO TABS: 5.0000 mg | ORAL_TABLET | Freq: Every evening | ORAL | Status: DC | PRN

## 2015-08-21 MED ORDER — LACTATED RINGERS IV SOLN
INTRAVENOUS | Status: DC
  Administered 2015-08-21: 125 mL/h via INTRAVENOUS
  Administered 2015-08-21: 05:00:00 via INTRAVENOUS

## 2015-08-21 MED ORDER — FLEET ENEMA 7-19 GM/118ML RE ENEM: 1.0000 | ENEMA | Freq: Every day | RECTAL | Status: DC | PRN

## 2015-08-21 MED ORDER — DIBUCAINE 1 % RE OINT: 1.0000 "application " | TOPICAL_OINTMENT | RECTAL | Status: DC | PRN

## 2015-08-21 MED ORDER — BENZOCAINE-MENTHOL 20-0.5 % EX AERO: 1.0000 "application " | INHALATION_SPRAY | CUTANEOUS | Status: DC | PRN

## 2015-08-21 MED ORDER — EPHEDRINE 5 MG/ML INJ
10.0000 mg | INTRAVENOUS | Status: DC | PRN
  Filled 2015-08-21: qty 2

## 2015-08-21 MED ORDER — OXYCODONE-ACETAMINOPHEN 5-325 MG PO TABS: 2.0000 | ORAL_TABLET | ORAL | Status: DC | PRN

## 2015-08-21 MED ORDER — ONDANSETRON HCL 4 MG PO TABS: 4.0000 mg | ORAL_TABLET | ORAL | Status: DC | PRN

## 2015-08-21 MED ORDER — METHYLERGONOVINE MALEATE 0.2 MG PO TABS: 0.2000 mg | ORAL_TABLET | ORAL | Status: DC | PRN

## 2015-08-21 MED ORDER — FERROUS SULFATE 325 (65 FE) MG PO TABS
325.0000 mg | ORAL_TABLET | Freq: Two times a day (BID) | ORAL | Status: DC
  Administered 2015-08-21 – 2015-08-22 (×2): 325 mg via ORAL
  Filled 2015-08-21 (×2): qty 1

## 2015-08-21 MED ORDER — LANOLIN HYDROUS EX OINT: TOPICAL_OINTMENT | CUTANEOUS | Status: DC | PRN

## 2015-08-21 MED ORDER — IBUPROFEN 600 MG PO TABS
600.0000 mg | ORAL_TABLET | Freq: Four times a day (QID) | ORAL | Status: DC
  Administered 2015-08-21 – 2015-08-22 (×5): 600 mg via ORAL
  Filled 2015-08-21 (×5): qty 1

## 2015-08-21 MED ORDER — OXYTOCIN BOLUS FROM INFUSION: 500.0000 mL | INTRAVENOUS | Status: DC

## 2015-08-21 MED ORDER — BISACODYL 10 MG RE SUPP: 10.0000 mg | Freq: Every day | RECTAL | Status: DC | PRN

## 2015-08-21 MED ORDER — SENNOSIDES-DOCUSATE SODIUM 8.6-50 MG PO TABS
2.0000 | ORAL_TABLET | ORAL | Status: DC
  Administered 2015-08-22: 2 via ORAL
  Filled 2015-08-21: qty 2

## 2015-08-21 MED ORDER — WITCH HAZEL-GLYCERIN EX PADS: 1.0000 "application " | MEDICATED_PAD | CUTANEOUS | Status: DC | PRN

## 2015-08-21 MED ORDER — SIMETHICONE 80 MG PO CHEW: 80.0000 mg | CHEWABLE_TABLET | ORAL | Status: DC | PRN

## 2015-08-21 MED ORDER — OXYTOCIN 40 UNITS IN LACTATED RINGERS INFUSION - SIMPLE MED
62.5000 mL/h | INTRAVENOUS | Status: DC
  Administered 2015-08-21: 62.5 mL/h via INTRAVENOUS
  Filled 2015-08-21: qty 1000

## 2015-08-21 MED ORDER — ACETAMINOPHEN 325 MG PO TABS: 650.0000 mg | ORAL_TABLET | ORAL | Status: DC | PRN

## 2015-08-21 MED ORDER — LIDOCAINE HCL (PF) 1 % IJ SOLN
30.0000 mL | INTRAMUSCULAR | Status: DC | PRN
  Filled 2015-08-21: qty 30

## 2015-08-21 MED ORDER — ONDANSETRON HCL 4 MG/2ML IJ SOLN: 4.0000 mg | Freq: Four times a day (QID) | INTRAMUSCULAR | Status: DC | PRN

## 2015-08-21 MED ORDER — OXYCODONE-ACETAMINOPHEN 5-325 MG PO TABS: 1.0000 | ORAL_TABLET | ORAL | Status: DC | PRN

## 2015-08-21 MED ORDER — DIPHENHYDRAMINE HCL 25 MG PO CAPS: 25.0000 mg | ORAL_CAPSULE | Freq: Four times a day (QID) | ORAL | Status: DC | PRN

## 2015-08-21 MED ORDER — CITRIC ACID-SODIUM CITRATE 334-500 MG/5ML PO SOLN: 30.0000 mL | ORAL | Status: DC | PRN

## 2015-08-21 MED ORDER — PRENATAL MULTIVITAMIN CH
1.0000 | ORAL_TABLET | Freq: Every day | ORAL | Status: DC
  Administered 2015-08-21 – 2015-08-22 (×2): 1 via ORAL
  Filled 2015-08-21 (×2): qty 1

## 2015-08-21 MED ORDER — OXYTOCIN 40 UNITS IN LACTATED RINGERS INFUSION - SIMPLE MED: 62.5000 mL/h | INTRAVENOUS | Status: DC | PRN

## 2015-08-21 MED ORDER — TETANUS-DIPHTH-ACELL PERTUSSIS 5-2.5-18.5 LF-MCG/0.5 IM SUSP: 0.5000 mL | Freq: Once | INTRAMUSCULAR | Status: DC

## 2015-08-21 MED ORDER — LACTATED RINGERS IV SOLN
500.0000 mL | INTRAVENOUS | Status: DC | PRN
  Administered 2015-08-21: 500 mL via INTRAVENOUS

## 2015-08-21 MED ORDER — FENTANYL 2.5 MCG/ML BUPIVACAINE 1/10 % EPIDURAL INFUSION (WH - ANES)
14.0000 mL/h | INTRAMUSCULAR | Status: DC | PRN
  Administered 2015-08-21: 14 mL/h via EPIDURAL
  Filled 2015-08-21: qty 125

## 2015-08-21 MED ORDER — MEASLES, MUMPS & RUBELLA VAC ~~LOC~~ INJ: 0.5000 mL | INJECTION | Freq: Once | SUBCUTANEOUS | Status: DC

## 2015-08-21 MED ORDER — LIDOCAINE HCL (PF) 1 % IJ SOLN
INTRAMUSCULAR | Status: DC | PRN
  Administered 2015-08-21 (×2): 4 mL via EPIDURAL

## 2015-08-21 MED ORDER — ONDANSETRON HCL 4 MG/2ML IJ SOLN: 4.0000 mg | INTRAMUSCULAR | Status: DC | PRN

## 2015-08-21 NOTE — Progress Notes (Signed)
   Kim Marks is a 34 y.o. [id number] at [redacted]w[redacted]d  admitted for active labor  Subjective:  Comfortable with epidural. Feeling some pressure Objective: Filed Vitals:   08/21/15 0630 08/21/15 0700 08/21/15 0730 08/21/15 0800  BP: 130/87 141/68 124/71 130/85  Pulse: 91 79 82 82  Temp:    97.8 F (36.6 C)  TempSrc:    Oral  Resp:    18  Height:      Weight:      SpO2:          FHT:  FHR: 140 bpm, variability: moderate,  accelerations:  Present,  decelerations:  Absent UC:   regular, every 4 minutes SVE:   Dilation: 7 Effacement (%): 100 Station: -1 Exam by:: Kim Marks CNM   Labs: Lab Results  Component Value Date   WBC 8.9 08/21/2015   HGB 12.3 08/21/2015   HCT 35.2* 08/21/2015   MCV 91.0 08/21/2015   PLT 160 08/21/2015    Assessment / Plan: Spontaneous labor, progressing normally  Labor: Progressing normally Fetal Wellbeing:  Category I Pain Control:  Epidural Anticipated MOD:  NSVD  Kim Marks 08/21/2015, 8:09 AM

## 2015-08-21 NOTE — Anesthesia Preprocedure Evaluation (Signed)
Anesthesia Evaluation  Patient identified by MRN, date of birth, ID band Patient awake    Reviewed: Allergy & Precautions, H&P , Patient's Chart, lab work & pertinent test results  Airway Mallampati: II  TM Distance: >3 FB Neck ROM: full    Dental no notable dental hx.    Pulmonary neg pulmonary ROS,    Pulmonary exam normal breath sounds clear to auscultation       Cardiovascular negative cardio ROS Normal cardiovascular exam Rhythm:regular Rate:Normal     Neuro/Psych negative neurological ROS  negative psych ROS   GI/Hepatic negative GI ROS, Neg liver ROS,   Endo/Other  negative endocrine ROS  Renal/GU negative Renal ROS  negative genitourinary   Musculoskeletal   Abdominal (+) + obese,   Peds  Hematology negative hematology ROS (+)   Anesthesia Other Findings   Reproductive/Obstetrics (+) Pregnancy                             Anesthesia Physical Anesthesia Plan  ASA: III  Anesthesia Plan: Epidural   Post-op Pain Management:    Induction:   Airway Management Planned:   Additional Equipment:   Intra-op Plan:   Post-operative Plan:   Informed Consent: I have reviewed the patients History and Physical, chart, labs and discussed the procedure including the risks, benefits and alternatives for the proposed anesthesia with the patient or authorized representative who has indicated his/her understanding and acceptance.     Plan Discussed with:   Anesthesia Plan Comments:         Anesthesia Quick Evaluation

## 2015-08-21 NOTE — MAU Note (Signed)
Pt presents to MAU with complaints of contractions that started around midnight with rupture of membranes at 0030.

## 2015-08-21 NOTE — Anesthesia Postprocedure Evaluation (Signed)
Anesthesia Post Note  Patient: Kim Marks  Procedure(s) Performed: * No procedures listed *  Anesthesia type: Epidural  Patient location: Mother/Baby  Post pain: Pain level controlled  Post assessment: Post-op Vital signs reviewed  Last Vitals:  Filed Vitals:   08/21/15 1223  BP: 126/77  Pulse: 67  Temp: 36.4 C  Resp: 14    Post vital signs: Reviewed  Level of consciousness:alert  Complications: No apparent anesthesia complications

## 2015-08-21 NOTE — H&P (Signed)
OBSTETRIC ADMISSION HISTORY AND PHYSICAL  Kim Marks is a 34 y.o. female [phone] with IUP at [redacted]w[redacted]d by 12wk u/s presenting for contractions and possible leaking of fluid. She reports +FMs, No LOF, no VB, no blurry vision, headaches or peripheral edema, and RUQ pain.  She plans on breast feeding. She request POP for birth control.  Dating: By Sung Amabile --->  Estimated Date of Delivery: 08/16/15  Sono:    @[redacted]w[redacted]d , CWD, normal anatomy, 403g   Prenatal History/Complications: [facility]  Initiated Care at  12 weeks  FOB Florinda Marker, [place], 3rd baby, married  Dating By 12 week [place]  Pap 2015 normal per pt  GC/CT Initial: -/- 36+wks:  Genetic Screen NT/IT/AFP: declined  CF screen declined  Anatomic [place] Normal female  Flu vaccine Declined flu shot  Tdap Recommended ~ 28wks  Glucose Screen  2 hr 77/113/88  GBS neg  Feed Preference breast  Contraception pills  Circumcision no  Childbirth Classes   Pediatrician       Past Medical History: Past Medical History  Diagnosis Date  . Miscarriage   . Tubal ectopic pregnancy     Past Surgical History: Past Surgical History  Procedure Laterality Date  . No past surgeries      Obstetrical History: OB History    Gravida Para Term Preterm AB TAB SAB Ectopic Multiple Living   7 4 4  0 2 0 1 1 0 4      Social History: Social History   Social History  . Marital Status: Married    Spouse Name: N/A  . Number of Children: N/A  . Years of Education: N/A   Social History Main Topics  . Smoking status: Never Smoker   . Smokeless tobacco: Never Used  . Alcohol Use: No  . Drug Use: No  . Sexual Activity: Yes    Birth Control/ Protection: None   Other Topics Concern  . None   Social History Narrative    Family History: Family History  Problem Relation Age of Onset  . Heart disease Mother     Allergies: No Known Allergies  Prescriptions prior to admission  Medication Sig Dispense Refill Last Dose   . acetaminophen (TYLENOL) 325 MG tablet Take 650 mg by mouth every 6 (six) hours as needed.   08/21/2015  . cetirizine (ZYRTEC) 10 MG tablet Take 10 mg by mouth as needed for allergies.   08/21/2015  . Prenat-FeFum-FePo-FA-Omega 3 (CONCEPT DHA) 53.5-38-1 MG CAPS Take 1 capsule by mouth daily. 30 capsule 11 08/21/2015     Review of Systems   All systems reviewed and negative except as stated in HPI  Blood pressure 136/81, pulse 102, temperature 98.6 F (37 C), resp. rate 20, height 5\' 5"  (1.651 m), weight 194 lb (87.998 kg), last menstrual period 11/20/2014. General appearance: alert, cooperative, appears stated age and moderate distress Lungs: clear to auscultation bilaterally Heart: regular rate and rhythm Abdomen: soft, non-tender; bowel sounds normal Extremities: Homans sign is negative, no sign of DVT DTR's 2/4 Presentation: cephalic Fetal monitoringBaseline: 140 bpm, Variability: Good {> 6 bpm), Accelerations: Reactive and Decelerations: Absent Uterine activityFrequency: Every 2 minutes Dilation: 3.5 Effacement (%): 70 Station: -3 Exam by:: ginger morris rn   Prenatal labs: ABO, Rh:   Antibody: Negative (06/16 0902) Rubella:   RPR: Non Reactive (06/16 0902)  HBsAg:    HIV: Non-reactive, Non-reactive (06/16 0000)  GBS: Negative (08/24 0000)  1 hr Glucola 113 Genetic screening  declined Anatomy US normal  Prenatal Transfer Tool  Maternal Diabetes: No Genetic Screening: Declined Maternal Ultrasounds/Referrals: Normal Fetal Ultrasounds or other Referrals:  None Maternal Substance Abuse:  No Significant Maternal Medications:  None Significant Maternal Lab Results: None  Results for orders placed or performed during the hospital encounter of 08/21/15 (from the past 24 hour(s))  Amnisure rupture of membrane (rom)not at [facility]   Collection Time: 08/21/15  3:20 AM  Result Value Ref Range   Amnisure ROM POSITIVE   CBC   Collection Time: 08/21/15  3:50 AM  Result Value  Ref Range   WBC 8.9 4.0 - 10.5 K/uL   RBC 3.87 3.87 - 5.11 MIL/uL   Hemoglobin 12.3 12.0 - 15.0 g/dL   HCT 53.6 (L) 64.4 - 03.4 %   MCV 91.0 78.0 - 100.0 fL   MCH 31.8 26.0 - 34.0 pg   MCHC 34.9 30.0 - 36.0 g/dL   RDW 74.2 59.5 - 63.8 %   Platelets 160 150 - 400 K/uL    Patient Active Problem List   Diagnosis Date Noted  . Active labor at term 08/21/2015  . Acute URI 04/02/2015  . Supervision of normal pregnancy 02/18/2015  . 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 Knee contusion 04/11/2012  . Knee pain 04/11/2012    Assessment: Kim Marks is a 34 y.o. [id number] at [redacted]w[redacted]d here for SOL  #Labor: Expectant management. Slight ferning but had sexual intercourse last night. Checked amnisure which was positive. #Pain: Epidural upon request #FWB: Category 1 tracing #ID:  GBS negative #MOF: breast #MOC:POP #Circ:  no #Rubella screen and HbSAg ordered as not previously done Durenda Hurt 08/21/2015, 4:56 AM    I have seen and examined this patient and agree the above assessment.    CRESENZO-DISHMAN,FRANCES 08/21/2015 8:06 AM

## 2015-08-21 NOTE — Anesthesia Procedure Notes (Signed)
Epidural Patient location during procedure: OB  Staffing Anesthesiologist: JUDD, BENJAMIN Performed by: anesthesiologist   Preanesthetic Checklist Completed: patient identified, site marked, surgical consent, pre-op evaluation, timeout performed, IV checked, risks and benefits discussed and monitors and equipment checked  Epidural Patient position: sitting Prep: site prepped and draped and DuraPrep Patient monitoring: continuous pulse ox and blood pressure Approach: midline Location: L3-L4 Injection technique: LOR saline  Needle:  Needle type: Tuohy  Needle gauge: 17 G Needle length: 9 cm and 9 Needle insertion depth: 7 cm Catheter type: closed end flexible Catheter size: 19 Gauge Catheter at skin depth: 12 cm Test dose: negative  Assessment Events: blood not aspirated, injection not painful, no injection resistance, negative IV test and no paresthesia  Additional Notes Patient identified. Risks/Benefits/Options discussed with patient including but not limited to bleeding, infection, nerve damage, paralysis, failed block, incomplete pain control, headache, blood pressure changes, nausea, vomiting, reactions to medications, itching and postpartum back pain. Confirmed with bedside nurse the patient's most recent platelet count. Confirmed with patient that they are not currently taking any anticoagulation, have any bleeding history or any family history of bleeding disorders. Patient expressed understanding and wished to proceed. All questions were answered. Sterile technique was used throughout the entire procedure. Please see nursing notes for vital signs. Test dose was given through epidural catheter and negative prior to continuing to dose epidural or start infusion. Warning signs of high block given to the patient including shortness of breath, tingling/numbness in hands, complete motor block, or any concerning symptoms with instructions to call for help. Patient was given  instructions on fall risk and not to get out of bed. All questions and concerns addressed with instructions to call with any issues or inadequate analgesia.  Reason for block:procedure for pain   

## 2015-08-21 NOTE — Lactation Note (Signed)
This note was copied from the chart of Kim Burnard Hawthorne. Lactation Consultation Note  Kim Marks interpreter [phone] P5.  BF First child for a year, breastfed 2nd for a few months. Hand expression taught to Mom. Great flow of colostrum. Attempted latching baby but he is sleepy.  Suggest unwrapping baby for feedings and place STS. Recently latched for 3 min.  Has bfx3 in 6hours of birth. Reviewed supply and demand and lactation brochure.    Patient Name: Kim Marks [id number] Date: 08/21/2015 Reason for consult: Initial assessment   Maternal Data    Feeding Feeding Type: Breast Fed Length of feed: 5 min  LATCH Score/Interventions                      Lactation Tools Discussed/Used     Consult Status Consult Status: Follow-up Date: 08/22/15 Follow-up type: In-patient    Dahlia Byes [facility] 08/21/2015, 3:05 PM

## 2015-08-21 NOTE — Progress Notes (Signed)
Offered to call Spanish interpreter during epidural. Patient declined. Husband at bedside helping with interpetation

## 2015-08-22 LAB — RUBELLA SCREEN: Rubella: 27 index (ref >0.99)

## 2015-08-22 MED ORDER — IBUPROFEN 600 MG PO TABS
600.0000 mg | ORAL_TABLET | Freq: Four times a day (QID) | ORAL | Status: AC | PRN
Start: 2015-08-22 — End: ?

## 2015-08-22 NOTE — Discharge Summary (Signed)
Obstetric Discharge Summary Reason for Admission: onset of labor and rupture of membranes Prenatal Procedures: ultrasound Intrapartum Procedures: spontaneous vaginal delivery Postpartum Procedures: none Complications-Operative and Postpartum: none Eating, drinking, voiding, ambulating well.  +flatus.  Lochia and pain wnl.  Denies dizziness, lightheadedness, or sob. No complaints.   Hospital Course: Kim Marks is a 34 y.o. [id number] female admited at 16.5wks w/ srom/early labor.  She progressed normally to uncomplicated NSVB. Her pp course has been uncomplicated.  By PPD#1 she is doing well, requests early d/c, and is deemed to have received the full benefit of her hospital stay.  Filed Vitals:   08/22/15 0533  BP: 109/62  Pulse: 69  Temp: 97.7 F (36.5 C)  Resp: 18   H/H: Lab Results  Component Value Date/Time   HGB 12.3 08/21/2015 03:50 AM   HCT 35.2* 08/21/2015 03:50 AM   HCT 34.9 05/21/2015 09:02 AM    Physical Exam: General: alert, cooperative and no distress Abdomen/Uterine Fundus: Appropriately tender, non-distended, FF @ U-2 Incision: n/a Lochia: appropriate Extremities: No evidence of DVT seen on physical exam. Negative Homan's sign, no cords, calf tenderness, or significant calf/ankle edema   Discharge Diagnoses: Term Pregnancy-delivered  Discharge Information: Date: 06/16/2011 Activity: pelvic rest Diet: routine  Medications: PNV and Ibuprofen Breast feeding: Yes Contraception: undecided, abstinence until pp visit Circumcision: no Condition: stable Instructions: refer to handout Discharge to: home  Infant: Home with mother pending d/c from peds  Follow-up Information    Follow up with [facility].   Specialty:  Obstetrics and Gynecology   Why:  4-6 weeks for your postpartum visit   Contact information:   [address] [place] [address] [phone]      Marge Duncans, CNM, WHNP-BC 08/22/2015,10:44  AM

## 2015-08-22 NOTE — Discharge Instructions (Signed)
NO SEX UNTIL AFTER YOU GET YOUR BIRTH CONTROL  °Cuidados en el postparto luego de un parto vaginal  °(Postpartum Care After Vaginal Delivery) °Después del parto (período de postparto), la estadía normal en el hospital es de 24-72 horas. Si hubo problemas con el trabajo de parto o el parto, o si tiene otros problemas médicos, es posible que deba permanecer en el hospital por más tiempo.  °Mientras esté en el hospital, recibirá ayuda e instrucciones sobre cómo cuidar de usted misma y de su bebé recién nacido durante el postparto.  °Mientras esté en el hospital:  °· Asegúrese de decirle a las enfermeras si siente dolor o malestar, así como donde siente el dolor y qué empeora el dolor. °· Si usted tuvo una incisión cerca de la vagina (episiotomía) o si ha tenido algún desgarro durante el parto, las enfermeras le pondrán hielo sobre la episiotomía o el desgarro. Las bolsas de hielo pueden ayudar a reducir el dolor y la hinchazón. °· Si está amamantando, puede sentir contracciones dolorosas en el útero durante algunas semanas. Esto es normal. Las contracciones ayudan a que el útero vuelva a su tamaño normal. °· Es normal tener algo de sangrado después del parto. °· Durante los primeros 1-3 días después del parto, el flujo es de color rojo y la cantidad puede ser similar a un período. °· Es frecuente que el flujo se inicie y se detenga. °· En los primeros días, puede eliminar algunos coágulos pequeños. Informe a las enfermeras si elimina coágulos grandes o aumenta el flujo. °· No  elimine los coágulos de sangre por el inodoro antes de que la enfermera los vea. °· Durante los próximos 3 a 10 días después del parto, el flujo debe ser más acuoso y rosado o marrón. °· De diez a catorce días después del parto, el flujo debe ser una pequeña cantidad de secreción de color blanco amarillento. °· La cantidad de flujo disminuirá en las primeras semanas después del parto. El flujo puede detenerse en 6-8 semanas. La mayoría de las  mujeres no tienen más flujo a las 12 semanas después del parto. °· Usted debe cambiar sus apósitos con frecuencia. °· Lávese bien las manos con agua y jabón durante al menos 20 segundos después de cambiar el apósito, usar el baño o antes de sostener o alimentar a su recién nacido. °· Usted podrá sentir como que tiene que vaciar la vejiga durante las primeras 6-8 horas después del parto. °· En caso de que sienta debilidad, mareo o desmayo, llame a la enfermera antes de levantarse de la cama por primera vez y antes de tomar una ducha por primera vez. °· Dentro de los primeros días después del parto, sus mamas pueden comenzar a estar sensibles y llenas. Esto se llama congestión. La sensibilidad en los senos por lo general desaparece dentro de las 48-72 horas después de que ocurre la congestión. También puede notar que la leche se escapa de sus senos. Si no está amamantando no estimule sus pechos. La estimulación de las mamas hace que sus senos produzcan más leche. °· Pasar tanto tiempo como le sea posible con el bebé recién nacido es muy importante. Durante ese tiempo, usted y su bebé deben sentirse cerca y conocerse uno al otro. Tener al bebé en su habitación (alojamiento conjunto) ayudará a fortalecer el vínculo con el bebé recién nacido. Esto le dará tiempo para conocerlo y atenderlo de manera cómoda. °· Las hormonas se modifican después del parto. A veces, los cambios hormonales pueden causar   tristeza o ganas de llorar por un tiempo. Estos sentimientos no deben durar más de unos pocos días. Si duran más que eso, debe hablar con su médico. °· Si lo desea, hable con su médico acerca de los métodos de planificación familiar o métodos anticonceptivos. °· Hable con su médico acerca de las vacunas. El médico puede indicarle que se aplique las siguientes vacunas antes de salir del hospital: °· Vacuna contra el tétanos, la difteria y la tos ferina (Tdap) o el tétanos y la difteria (Td). Es muy importante que usted y su  familia (incluyendo a los abuelos) u otras personas que cuidan al recién nacido estén al día con las vacunas Tdap o Td. Las vacunas Tdap o Td pueden ayudar a proteger al recién nacido de enfermedades. °· Inmunización contra la rubéola. °· Inmunización contra la varicela. °· Inmunización contra la gripe. Usted debe recibir esta vacunación anual si no la ha recibido durante el embarazo. °Document Released: 09/18/2007 Document Revised: 08/15/2012 °ExitCare® Patient Information ©2015 [employer]. This information is not intended to replace advice given to you by your health care provider. Make sure you discuss any questions you have with your health care provider. ° °Lactancia materna °(Breastfeeding) °Decidir amamantar es una de las mejores elecciones que puede hacer por usted y su bebé. El cambio hormonal durante el embarazo produce el desarrollo del tejido mamario y aumenta la cantidad y el tamaño de los conductos galactóforos. Estas hormonas también permiten que las proteínas, los azúcares y las grasas de la sangre produzcan la leche materna en las glándulas productoras de leche. Las hormonas impiden que la leche materna sea liberada antes del nacimiento del bebé, además de impulsar el flujo de leche luego del nacimiento. Una vez que ha comenzado a amamantar, pensar en el bebé, así como la succión o el llanto, pueden estimular la liberación de leche de las glándulas productoras de leche.  °LOS BENEFICIOS DE AMAMANTAR °Para el bebé °· La primera leche (calostro) ayuda a mejorar el funcionamiento del sistema digestivo del bebé. °· La leche tiene anticuerpos que ayudan a prevenir las infecciones en el bebé. °· El bebé tiene una menor incidencia de asma, alergias y del síndrome de muerte súbita del lactante. °· Los nutrientes en la leche materna son mejores para el bebé que la leche maternizada y están preparados exclusivamente para cubrir las necesidades del bebé. °· La leche materna mejora el desarrollo cerebral del  bebé. °· Es menos probable que el bebé desarrolle otras enfermedades, como obesidad infantil, asma o diabetes mellitus de tipo 2. °Para usted  °· La lactancia materna favorece el desarrollo de un vínculo muy especial entre la madre y el bebé. °· Es conveniente. La leche materna siempre está disponible a la temperatura correcta y es económica. °· La lactancia materna ayuda a quemar calorías y a perder el peso ganado durante el embarazo. °· Favorece la contracción del útero al tamaño que tenía antes del embarazo de manera más rápida y disminuye el sangrado (loquios) después del parto. °· La lactancia materna contribuye a reducir el riesgo de desarrollar diabetes mellitus de tipo 2, osteoporosis o cáncer de mama o de ovario en el futuro. °SIGNOS DE QUE EL BEBÉ ESTÁ HAMBRIENTO °Primeros signos de hambre °· Aumenta su estado de alerta o actividad. °· Se estira. °· Mueve la cabeza de un lado a otro. °· Mueve la cabeza y abre la boca cuando se le toca la mejilla o la comisura de la boca (reflejo de búsqueda). °· Aumenta las   vocalizaciones, tales como sonidos de succión, se relame los labios, emite arrullos, suspiros, o chirridos. °· Mueve la mano hacia la boca. °· Se chupa con ganas los dedos o las manos. °Signos tardíos de hambre °· Está agitado. °· Llora de manera intermitente. °Signos de hambre extrema °Los signos de hambre extrema requerirán que lo calme y lo consuele antes de que el bebé pueda alimentarse adecuadamente. No espere a que se manifiesten los siguientes signos de hambre extrema para comenzar a amamantar:  °· Agitación. °· Llanto intenso y fuerte. °·  Gritos. °INFORMACIÓN BÁSICA SOBRE LA LACTANCIA MATERNA °Iniciación de la lactancia materna °· Encuentre un lugar cómodo para sentarse o acostarse, con un buen respaldo para el cuello y la espalda. °· Coloque una almohada o una manta enrollada debajo del bebé para acomodarlo a la altura de la mama (si está sentada). Las almohadas para amamantar se han diseñado  especialmente a fin de servir de apoyo para los brazos y el bebé mientras amamanta. °· Asegúrese de que el abdomen del bebé esté frente al suyo. °· Masajee suavemente la mama. Con las yemas de los dedos, masajee la pared del pecho hacia el pezón en un movimiento circular. Esto estimula el flujo de leche. Es posible que deba continuar este movimiento mientras amamanta si la leche fluye lentamente. °· Sostenga la mama con el pulgar por arriba del pezón y los otros 4 dedos por debajo de la mama. Asegúrese de que los dedos se encuentren lejos del pezón y de la boca del bebé. °· Empuje suavemente los labios del bebé con el pezón o con el dedo. °· Cuando la boca del bebé se abra lo suficiente, acérquelo rápidamente a la mama e introduzca todo el pezón y la zona oscura que lo rodea (areola), tanto como sea posible, dentro de la boca del bebé. °¨ Debe haber más areola visible por arriba del labio superior del bebé que por debajo del labio inferior. °¨ La lengua del bebé debe estar entre la encía inferior y la mama. °· Asegúrese de que la boca del bebé esté en la posición correcta alrededor del pezón (prendida). Los labios del bebé deben crear un sello sobre la mama y estar doblados hacia afuera (invertidos). °· Es común que el bebé succione durante 2 a 3 minutos para que comience el flujo de leche materna. °Cómo debe prenderse °Es muy importante que le enseñe al bebé cómo prenderse adecuadamente a la mama. Si el bebé no se prende adecuadamente, puede causarle dolor en el pezón y reducir la producción de leche materna, y hacer que el bebé tenga un escaso aumento de peso. Además, si el bebé no se prende adecuadamente al pezón, puede tragar aire durante la alimentación. Esto puede causarle molestias al bebé. Hacer eructar al bebé al cambiar de mama puede ayudarlo a liberar el aire. Sin embargo, enseñarle al bebé cómo prenderse a la mama adecuadamente es la mejor manera de evitar que se sienta molesto por tragar aire mientras  se alimenta. °Signos de que el bebé se ha prendido adecuadamente al pezón:  °· Tironea o succiona de modo silencioso, sin causarle dolor. °· Se escucha que traga cada 3 o 4 succiones. °·  Hay movimientos musculares por arriba y por delante de sus oídos al succionar. °Signos de que el bebé no se ha prendido adecuadamente al pezón:  °· Hace ruidos de succión o de chasquido mientras se alimenta. °· Siente dolor en el pezón. °Si cree que el bebé no se prendió correctamente, deslice   el dedo en la comisura de la boca y colóquelo entre las encías del bebé para interrumpir la succión. Intente comenzar a amamantar nuevamente. °Signos de lactancia materna exitosa °Signos del bebé:  °· Disminuye gradualmente el número de succiones o cesa la succión por completo. °· Se duerme. °· Relaja el cuerpo. °· Retiene una pequeña cantidad de leche en la boca. °· Se desprende solo del pecho. °Signos que presenta usted: °· Las mamas han aumentado la firmeza, el peso y el tamaño 1 a 3 horas después de amamantar. °· Están más blandas inmediatamente después de amamantar. °· Un aumento del volumen de leche, y también un cambio en su consistencia y color se producen hacia el quinto día de lactancia materna. °· Los pezones no duelen, ni están agrietados ni sangran. °Signos de que su bebé recibe la cantidad de leche suficiente °· Moja al menos 3 pañales en 24 horas. La orina debe ser clara y de color amarillo pálido a los 5 días de vida. °· Defeca al menos 3 veces en 24 horas a los 5 días de vida. La materia fecal debe ser blanda y amarillenta. °· Defeca al menos 3 veces en 24 horas a los 7 días de vida. La materia fecal debe ser grumosa y amarillenta. °· No registra una pérdida de peso mayor del 10 % del peso al nacer durante los primeros 3 días de vida. °· Aumenta de peso un promedio de 4 a 7 onzas (113 a 198 g) por semana después de los 4 días de vida. °· Aumenta de peso, diariamente, de manera uniforme a partir de los 5 días de vida, sin  registrar pérdida de peso después de las 2 semanas de vida. °Después de alimentarse, es posible que el bebé regurgite una pequeña cantidad. Esto es frecuente. °FRECUENCIA Y DURACIÓN DE LA LACTANCIA MATERNA °El amamantamiento frecuente la ayudará a producir más leche y a prevenir problemas de dolor en los pezones e hinchazón en las mamas. Alimente al bebé cuando muestre signos de hambre o si siente la necesidad de reducir la congestión de las mamas. Esto se denomina "lactancia a demanda". Evite el uso del chupete mientras trabaja para establecer la lactancia (las primeras 4 a 6 semanas después del nacimiento del bebé). Después de este período, podrá ofrecerle un chupete. Las investigaciones demostraron que el uso del chupete durante el primer año de vida del bebé disminuye el riesgo de desarrollar el síndrome de muerte súbita del lactante (SMSL). °Permita que el niño se alimente en cada mama todo lo que desee. Continúe amamantando al bebé hasta que haya terminado de alimentarse. Cuando el bebé se desprende o se queda dormido mientras se está alimentando de la primera mama, ofrézcale la segunda. Debido a que, con frecuencia, los recién nacidos permanecen somnolientos las primeras semanas de vida, es posible que deba despertar al bebé para alimentarlo. °Los horarios de lactancia varían de un bebé a otro. Sin embargo, las siguientes reglas pueden servir como guía para ayudarla a garantizar que el bebé se alimenta adecuadamente: °· Se puede amamantar a los recién nacidos (bebés de 4 semanas o menos de vida) cada 1 a 3 horas. °· No deben transcurrir más de 3 horas durante el día o 5 horas durante la noche sin que se amamante a los recién nacidos. °· Debe amamantar al bebé 8 veces como mínimo en un período de 24 horas, hasta que comience a introducir sólidos en su dieta, a los 6 meses de vida aproximadamente. °EXTRACCIÓN DE LECHE MATERNA °La extracción y el almacenamiento   de la leche materna le permiten asegurarse de que  el bebé se alimente exclusivamente de leche materna, aun en momentos en los que no puede amamantar. Esto tiene especial importancia si debe regresar al trabajo en el período en que aún está amamantando o si no puede estar presente en los momentos en que el bebé debe alimentarse. Su asesor en lactancia puede orientarla sobre cuánto tiempo es seguro almacenar leche materna.  °El sacaleche es un aparato que le permite extraer leche de la mama a un recipiente estéril. Luego, la leche materna extraída puede almacenarse en un refrigerador o congelador. Algunos sacaleches son manuales, mientras que otros son eléctricos. Consulte a su asesor en lactancia qué tipo será más conveniente para usted. Los sacaleches se pueden comprar; sin embargo, algunos hospitales y grupos de apoyo a la lactancia materna alquilan sacaleches mensualmente. Un asesor en lactancia puede enseñarle cómo extraer leche materna manualmente, en caso de que prefiera no usar un sacaleche.  °CÓMO CUIDAR LAS MAMAS DURANTE LA LACTANCIA MATERNA °Los pezones se secan, agrietan y duelen durante la lactancia materna. Las siguientes recomendaciones pueden ayudarla a mantener las mamas humectadas y sanas: °· Evite usar jabón en los pezones. °· Use un sostén de soporte. Aunque no son esenciales, las camisetas sin mangas o los sostenes especiales para amamantar están diseñados para acceder fácilmente a las mamas, para amamantar sin tener que quitarse todo el sostén o la camiseta. Evite usar sostenes con aro o sostenes muy ajustados. °· Seque al aire sus pezones durante 3 a 4 minutos después de amamantar al bebé. °· Utilice solo apósitos de algodón en el sostén para absorber las pérdidas de leche. La pérdida de un poco de leche materna entre las tomas es normal. °· Utilice lanolina sobre los pezones luego de amamantar. La lanolina ayuda a mantener la humedad normal de la piel. Si usa lanolina pura, no tiene que lavarse los pezones antes de volver a alimentar al bebé.  La lanolina pura no es tóxica para el bebé. Además, puede extraer manualmente algunas gotas de leche materna y masajear suavemente esa leche sobre los pezones, para que la leche se seque al aire. °Durante las primeras semanas después de dar a luz, algunas mujeres pueden experimentar hinchazón en las mamas (congestión mamaria). La congestión puede hacer que sienta las mamas pesadas, calientes y sensibles al tacto. El pico de la congestión ocurre dentro de los 3 a 5 días después del parto. Las siguientes recomendaciones pueden ayudarla a aliviar la congestión: °· Vacíe por completo las mamas al amamantar o extraer leche. Puede aplicar calor húmedo en las mamas (en la ducha o con toallas húmedas para manos) antes de amamantar o extraer leche. Esto aumenta la circulación y ayuda a que la leche fluya. Si el bebé no vacía por completo las mamas cuando lo amamanta, extraiga la leche restante después de que haya finalizado. °· Use un sostén ajustado (para amamantar o común) o una camiseta sin mangas durante 1 o 2 días para indicar al cuerpo que disminuya ligeramente la producción de leche. °· Aplique compresas de hielo sobre las mamas, a menos que le resulte demasiado incómodo. °· Asegúrese de que el bebé esté prendido y se encuentre en la posición correcta mientras lo alimenta. °Si la congestión persiste luego de 48 horas o después de seguir estas recomendaciones, comuníquese con su médico o un asesor en lactancia. °RECOMENDACIONES GENERALES PARA EL CUIDADO DE LA SALUD DURANTE LA LACTANCIA MATERNA °· Consuma alimentos saludables. Alterne comidas y colaciones, y coma 3   de cada una por día. Dado que lo que come afecta la leche materna, es posible que algunas comidas hagan que su bebé se vuelva más irritable de lo habitual. Evite comer este tipo de alimentos si percibe que afectan de manera negativa al bebé. °· Beba leche, jugos de fruta y agua para satisfacer su sed (aproximadamente 10 vasos al día). °· Descanse con  frecuencia, relájese y tome sus vitaminas prenatales para evitar la fatiga, el estrés y la anemia. °· Continúe con los autocontroles de la mama. °· Evite masticar y fumar tabaco. °· Evite el consumo de alcohol y drogas. °Algunos medicamentos, que pueden ser perjudiciales para el bebé, pueden pasar a través de la leche materna. Es importante que consulte a su médico antes de tomar cualquier medicamento, incluidos todos los medicamentos recetados y de venta libre, así como los suplementos vitamínicos y herbales. °Puede quedar embarazada durante la lactancia. Si desea controlar la natalidad, consulte a su médico cuáles son las opciones más seguras para el bebé. °SOLICITE ATENCIÓN MÉDICA SI:  °· Usted siente que quiere dejar de amamantar o se siente frustrada con la lactancia. °· Siente dolor en las mamas o en los pezones. °· Sus pezones están agrietados o sangran. °· Sus pechos están irritados, sensibles o calientes. °· Tiene un área hinchada en cualquiera de las mamas. °· Siente escalofríos o fiebre. °· Tiene náuseas o vómitos. °· Presenta una secreción de otro líquido distinto de la leche materna de los pezones. °· Sus mamas no se llenan antes de amamantar al bebé para el quinto día después del parto. °· Se siente triste y deprimida. °· El bebé está demasiado somnoliento como para comer bien. °· El bebé tiene problemas para dormir. °· Moja menos de 3 pañales en 24 horas. °· Defeca menos de 3 veces en 24 horas. °· La piel del bebé o la parte blanca de los ojos se vuelven amarillentas. °· El bebé no ha aumentado de peso a los 5 días de vida. °SOLICITE ATENCIÓN MÉDICA DE INMEDIATO SI:  °· El bebé está muy cansado (letargo) y no se quiere despertar para comer. °· Le sube la fiebre sin causa. °Document Released: 11/21/2005 Document Revised: 11/26/2013 °[employer]® Patient Information ©2015 [employer]. This information is not intended to replace advice given to you by your health care provider. Make sure you discuss any  questions you have with your health care provider. ° ° °

## 2015-08-23 ENCOUNTER — Ambulatory Visit: Payer: Self-pay

## 2015-08-23 ENCOUNTER — Inpatient Hospital Stay (HOSPITAL_COMMUNITY): Admission: RE | Admit: 2015-08-23 | Payer: Managed Care, Other (non HMO) | Source: Ambulatory Visit

## 2015-08-23 NOTE — Progress Notes (Signed)
Checked on patients needs.  °Spanish Interpreter  °

## 2015-08-23 NOTE — Lactation Note (Signed)
This note was copied from the chart of Kim Marks. Lactation Consultation Note: Mother states that she can tell that infant is drinking well. She states that infant has a strong tug and she hears swallows. Mother has good understanding of english, but has a difficult time speaking. Father of infant at the beside to interpret as needed. Mother request a hand pump. Staff nurse gave pump to mother with instructions to use as needed. Mother receptive to all teaching on treatment to prevent engorgement. Mother advised to continue to breastfeed infant frequently. And do STS. Mother is aware of available LC services as needed.  Patient Name: Kim Kim Marks [id number] Date: 08/23/2015 Reason for consult: Follow-up assessment   Maternal Data    Feeding Feeding Type: Breast Fed Length of feed: 25 min  LATCH Score/Interventions                      Lactation Tools Discussed/Used Pump Review: Setup, frequency, and cleaning Initiated by:: Pleas Koch Rn  Date initiated:: 08/23/15   Consult Status Consult Status: Complete    Michel Bickers 08/23/2015, 2:58 PM

## 2015-10-01 ENCOUNTER — Ambulatory Visit: Payer: Commercial Indemnity | Admitting: Advanced Practice Midwife

## 2015-10-06 ENCOUNTER — Encounter: Payer: Self-pay | Admitting: *Deleted

## 2015-10-06 ENCOUNTER — Ambulatory Visit: Payer: Commercial Indemnity | Admitting: Women's Health
